# Patient Record
Sex: Female | Born: 1985 | Hispanic: No | Marital: Married | Smoking: Never smoker
Health system: Southern US, Community
[De-identification: ages and names within clinical notes are randomized; demographics above are authoritative.]

## PROBLEM LIST (undated history)

## (undated) DIAGNOSIS — D649 Anemia, unspecified: Secondary | ICD-10-CM

## (undated) DIAGNOSIS — Z789 Other specified health status: Secondary | ICD-10-CM

## (undated) HISTORY — DX: Other specified health status: Z78.9

## (undated) HISTORY — PX: NO PAST SURGERIES: SHX2092

---

## 2012-04-16 ENCOUNTER — Other Ambulatory Visit: Payer: Self-pay

## 2012-04-16 LAB — OB RESULTS CONSOLE ANTIBODY SCREEN: Antibody Screen: NEGATIVE

## 2012-04-16 LAB — OB RESULTS CONSOLE GC/CHLAMYDIA
Chlamydia: NEGATIVE
Gonorrhea: NEGATIVE

## 2012-04-16 LAB — OB RESULTS CONSOLE ABO/RH

## 2012-05-12 ENCOUNTER — Other Ambulatory Visit (HOSPITAL_COMMUNITY): Payer: Self-pay | Admitting: Obstetrics and Gynecology

## 2012-05-12 DIAGNOSIS — Q369 Cleft lip, unilateral: Secondary | ICD-10-CM

## 2012-05-12 DIAGNOSIS — Z3689 Encounter for other specified antenatal screening: Secondary | ICD-10-CM

## 2012-05-20 ENCOUNTER — Encounter (HOSPITAL_COMMUNITY): Payer: Self-pay

## 2012-05-20 ENCOUNTER — Ambulatory Visit (HOSPITAL_COMMUNITY)
Admission: RE | Admit: 2012-05-20 | Discharge: 2012-05-20 | Disposition: A | Discharge status: Home / Self Care | Payer: Medicaid Other | Source: Ambulatory Visit | Attending: Obstetrics and Gynecology | Admitting: Obstetrics and Gynecology

## 2012-05-20 DIAGNOSIS — Z1389 Encounter for screening for other disorder: Secondary | ICD-10-CM | POA: Insufficient documentation

## 2012-05-20 DIAGNOSIS — O358XX0 Maternal care for other (suspected) fetal abnormality and damage, not applicable or unspecified: Secondary | ICD-10-CM | POA: Insufficient documentation

## 2012-05-20 DIAGNOSIS — Z363 Encounter for antenatal screening for malformations: Secondary | ICD-10-CM | POA: Insufficient documentation

## 2012-05-20 DIAGNOSIS — Z3689 Encounter for other specified antenatal screening: Secondary | ICD-10-CM

## 2012-05-20 DIAGNOSIS — Q369 Cleft lip, unilateral: Secondary | ICD-10-CM

## 2012-05-20 HISTORY — DX: Anemia, unspecified: D64.9

## 2012-05-20 NOTE — Progress Notes (Signed)
Genetic Counseling  High-Risk Gestation Note  Appointment Date:  05/20/2012 Referred By: [name], MD Date of Birth:  1986-03-06    Pregnancy History: G1P0 Estimated Date of Delivery: 10/05/12 Estimated Gestational Age: [redacted]w[redacted]d Attending: [name], MD   Pamela Hoover and her partner, Pamela Hoover, were seen for genetic counseling because of previous ultrasound finding of fetal cleft lip.   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 Left, mild pyelectasis was also visualized. Complete ultrasound results reported separately.   We discussed the ultrasound finding of cleft lip and palate. In normal embryological development, the fetal lip usually closes by 7-[redacted] weeks gestation and the fetal palate usually closes by [redacted] weeks gestation.  When parts of these structures do not fuse properly, cleft lip and/or palate (CL/P) results.  CL/P is twice as common in males as it is in females.  Approximately 25% (1 in 4) of all cleft lip and/or palate (CL/P) cases are cleft lip only, 50% (1 in 2) are cleft lip and palate, and 25% (1 in 4) are cleft palate only.  The incidence of CL/P varies in different ethnic populations.  In addition to ethnicity, other factors may increase the chance of a CL/P including some prenatal exposures, alcohol and drug use, cigarette smoking, or folic acid deficiency.  We also discussed the ultrasound finding of pyelectasis. We discussed that fetal pyelectasis is defined as the dilatation of the fetal renal pelvis/pelvises due to excess urine. This finding is estimated to occur in 2-3% of fetuses.  The female to female ratio is 2:1.  Typically, babies with mild pyelectasis are born normal and healthy and we are usually unable to determine why this extra fluid is present.  This urine accumulation may regress, stay the same or continue to accumulate.  The more fluid that accumulates, the more likely this fluid could be the result of a  compromise in kidney function, an obstruction, or narrowing of the ureters which transport urine out of the body, thus causing backflow of fluid into the kidneys.  Therefore, it is important to follow pyelectasis to make sure it does not become more concerning.  Also, in some cases postnatal evaluation of baby's kidneys may be warranted.  We discussed that the finding of pyelectasis is associated with an increased risk for fetal aneuploidy.  This risk is highest when other anomalies or fetal differences are visualized.  Cleft lip and palate (CL/P) is most often an isolated condition, but can be present in combination with other birth defects possibly as part of a genetic syndrome. Approximately, 7-13% of individuals with a cleft lip and 11-14% of individuals with a cleft lip and palate are born with additional birth defects.  Many genetic syndromes are associated with cleft lip and/or palate which may not be identified on ultrasound and would not be detected by amniocentesis.  For this reason, a genetics evaluation may be recommended sometime after birth in order to assess for an underlying genetic syndrome.  When there is no syndrome as the cause, then the cleft lip or palate is typically suspected to be caused by a combination of genetic and environmental factors (multifactorial inheritance).   We reviewed chromosomes and genes, and examples of chromosome conditions. We reviewed that [name] previously had first trimester screening which reportedly reduced the risks for fetal Down syndrome, trisomy 73, and trisomy 13 to less than 1 in 10,000. This screen does not diagnose or rule out these conditions but adjusts the  a priori risk to provide a pregnancy specific risk assessment. We discussed that the ultrasound findings increased the risk for underlying fetal aneuploidy. We discussed that cleft lip +/- palate is associated with 1-4% risk for fetal aneuploidy.  We discussed the option of amniocentesis,  including the 1 in 300-500 risk for complications including spontaneous pregnancy loss.  They understand that amniocentesis allows evaluation of the fetal chromosomes, but cannot detect all genetic conditions.  Specifically, we discussed that single gene conditions are difficult to diagnose prenatally unless a specific condition is suspected based on additional ultrasound findings or family history. We discussed the risks, limitations, and benefits of each screening and testing option. After thoughtful consideration, this couple declined amniocentesis, stating that they would prefer to pursue any additional genetic testing postnatally, if warranted at that time.  Additionally, noninvasive prenatal testing (NIPT) is available as a screening option. This option was not discussed in detail at this time, given that the couple indicated that they did not want to pursue additional testing regarding potential additional underlying medical concerns in pregnancy at this time. The option of having their baby evaluated by a Medical Geneticist is also available so that a potential diagnosis for the baby and, ultimately, a recurrence risk for this couple can be made.  Without further information, we may not be able to answer questions related to why this happened or the recurrence risk for future pregnancies.    Given that the finding of cleft lip +/- palate is associated with an increased risk for additional structural anomalies, we discussed the option of a fetal echocardiogram.  Additionally, we discussed the option of meeting with a pediatric plastic and reconstructive surgeon to discussed management and treatment of cleft lip and palate. We discussed that there are physicians from [facility] who provide consultative services in [place]. The couple declined fetal echocardiogram and prenatal consultation with pediatric plastic and reconstructive surgery at this time. They stated that they  preferred to follow-up with plastic surgery and craniofacial team postnatally. The couple stated that they would contact our office if they reconsidered and desired either of these appointments during pregnancy. A follow-up ultrasound was planned in 6 weeks to reassess fetal kidneys.   Both family histories were reviewed and found to be noncontributory for birth defects, mental retardation, and known genetic conditions. Without further information regarding the provided family history, an accurate genetic risk cannot be calculated. Further genetic counseling is warranted if more information is obtained.  Ms. Jamira Barfuss denied exposure to environmental toxins or chemical agents. She denied the use of alcohol, tobacco or street drugs. She denied significant viral illnesses during the course of her pregnancy. Her medical and surgical histories were noncontributory.   I counseled this couple regarding the above risks and available options.  The approximate face-to-face time with the genetic counselor was 30 minutes.  Quinn Plowman, MS,  Certified Genetic Counselor 05/20/2012

## 2012-06-11 ENCOUNTER — Encounter (HOSPITAL_COMMUNITY): Payer: Self-pay | Admitting: Emergency Medicine

## 2012-06-11 ENCOUNTER — Emergency Department (HOSPITAL_COMMUNITY)
Admission: EM | Admit: 2012-06-11 | Discharge: 2012-06-12 | Discharge status: Against Medical Advice | Payer: Medicaid Other | Attending: Emergency Medicine | Admitting: Emergency Medicine

## 2012-06-11 DIAGNOSIS — Z3490 Encounter for supervision of normal pregnancy, unspecified, unspecified trimester: Secondary | ICD-10-CM

## 2012-06-11 DIAGNOSIS — O99891 Other specified diseases and conditions complicating pregnancy: Secondary | ICD-10-CM | POA: Insufficient documentation

## 2012-06-11 DIAGNOSIS — R109 Unspecified abdominal pain: Secondary | ICD-10-CM

## 2012-06-11 DIAGNOSIS — O99019 Anemia complicating pregnancy, unspecified trimester: Secondary | ICD-10-CM | POA: Insufficient documentation

## 2012-06-11 DIAGNOSIS — D649 Anemia, unspecified: Secondary | ICD-10-CM | POA: Insufficient documentation

## 2012-06-11 NOTE — ED Notes (Addendum)
C/o RUQ and LLQ pain since yesterday.  Reports sob.  Pt states she is 5 months pregnant.  Due date 10/05/12. Denies urinary complaints.  OB-GYN is Dr. Senaida Ores at [facility].  Last appt 2-3 weeks ago.

## 2012-06-11 NOTE — ED Notes (Signed)
Pt c/o RUQ and LLQ abd pain since yesterday, pt describes pain as sharp and constant pain. Pt reports she has been feeling the baby move.

## 2012-06-11 NOTE — ED Notes (Signed)
Maxine Glenn, [place] notifying [facility] nurse that pt is here.

## 2012-06-11 NOTE — Progress Notes (Signed)
Pt is a G1P0, at 23wks 3 days (EDC 10/05/12) gestation.  She complains of RUQ and LLQ pain greater then 24 hrs.  Fetal monitoring, after 20 minutes; FHT 145bpm, 10x10 accels and occ mild variable. 2 contractions seen on 20 minute tracing, but pt did not complain of pain and abd remained soft.  Fetal monitoring d/c'd and toco remains in place after 20 minute tracing.  Marcie Bal RN, [place]

## 2012-06-12 ENCOUNTER — Ambulatory Visit (HOSPITAL_COMMUNITY): Admission: RE | Admit: 2012-06-12 | Payer: Medicaid Other | Source: Ambulatory Visit

## 2012-06-12 NOTE — Progress Notes (Signed)
Pt states she feels much better and must leave now and has removed toco monitor.  She has had no more contractions in an hour.  Dr Ellyn Hack paged @ [phone] and has not yet returned call.  Pt states she does not want to wait for me to talk to Dr. Ellyn Hack.  Advised patient that if she feels worse she should go to [facility] and if it is during the day she should call Dr. Emeline Darling office. Marcie Bal RN  [place]

## 2012-06-12 NOTE — ED Provider Notes (Addendum)
History     CSN: [id number]  Arrival date & time 06/11/12  2223   First MD Initiated Contact with Patient 06/12/12 0007      Chief Complaint  Patient presents with  . Abdominal Pain    (Consider location/radiation/quality/duration/timing/severity/associated sxs/prior treatment) Patient is a 26 y.o. female presenting with abdominal pain. The history is provided by the patient.  Abdominal Pain The primary symptoms of the illness include abdominal pain. The primary symptoms of the illness do not include fever, nausea, vomiting, diarrhea, vaginal discharge or vaginal bleeding. The current episode started yesterday. The onset of the illness was sudden. The problem has not changed (intermittent pain) since onset. The abdominal pain is located in the RUQ and LLQ. The abdominal pain does not radiate. The severity of the abdominal pain is 7/10 (currently pain is gone). The abdominal pain is relieved by nothing. The abdominal pain is exacerbated by eating.  The patient states that she believes she is currently pregnant. Symptoms associated with the illness do not include chills, anorexia, constipation, urgency, frequency or back pain.    Past Medical History  Diagnosis Date  . Anemia     History reviewed. No pertinent past surgical history.  No family history on file.  History  Substance Use Topics  . Smoking status: Never Smoker   . Smokeless tobacco: Not on file  . Alcohol Use: No    OB History    Grav Para Term Preterm Abortions TAB SAB Ect Mult Living   1               Review of Systems  Constitutional: Negative for fever and chills.  Gastrointestinal: Positive for abdominal pain. Negative for nausea, vomiting, diarrhea, constipation and anorexia.  Genitourinary: Negative for urgency, frequency, vaginal bleeding and vaginal discharge.  Musculoskeletal: Negative for back pain.  All other systems reviewed and are negative.    Allergies  Review of patient's allergies  indicates no known allergies.  Home Medications   Current Outpatient Rx  Name Route Sig Dispense Refill  . IRON PO Oral Take 1 tablet by mouth 2 (two) times daily.      BP 116/66  Pulse 94  Temp 98.4 F (36.9 C) (Oral)  Resp 17  SpO2 98%  LMP 12/30/2011  Physical Exam  Nursing note and vitals reviewed. Constitutional: She is oriented to person, place, and time. She appears well-developed and well-nourished. No distress.  HENT:  Head: Normocephalic and atraumatic.  Mouth/Throat: Oropharynx is clear and moist.  Eyes: Conjunctivae normal and EOM are normal. Pupils are equal, round, and reactive to light.  Neck: Normal range of motion. Neck supple.  Cardiovascular: Normal rate, regular rhythm and intact distal pulses.   No murmur heard. Pulmonary/Chest: Effort normal and breath sounds normal. No respiratory distress. She has no wheezes. She has no rales.  Abdominal: Soft. She exhibits no distension. There is tenderness in the right upper quadrant. There is no rebound, no guarding and negative Murphy's sign.       Gravid uterus just above the umbilicus  Musculoskeletal: Normal range of motion. She exhibits no edema and no tenderness.  Neurological: She is alert and oriented to person, place, and time.  Skin: Skin is warm and dry. No rash noted. No erythema.  Psychiatric: She has a normal mood and affect. Her behavior is normal.    ED Course  Procedures (including critical care time)  Labs Reviewed - No data to display No results found.   1. Abdominal  pain, other specified site   2. Pregnant       MDM   Patient is [redacted] weeks pregnant and arrived to 2 right upper quadrant and right lower quadrant pain that started yesterday. The pain is worse after eating. And then it resolves for some time. The OB nurse has already been in to see the patient and she's been on the monitor for over an hour when I saw her. She had had 2 contractions early on but the baby is in no distress and  patient has had no further contractions. With the contractions the patient's abdomen was not tightening. On exam she still has mild right upper caught her and tenderness in with her symptoms concerning for possible: Lithiasis. Discussed with her about getting blood work in the right upper quadrant ultrasound. However patient refused and states she was feeling better and decided to go home. She states that she will followup with her doctor if things worsen to return to [facility]. Do not feel that patient isn't an emminent danger of delivery. She did sign out [facility]        Gwyneth Sprout, MD 06/12/12 0440  Gwyneth Sprout, MD 06/12/12 [phone]

## 2012-06-12 NOTE — ED Notes (Signed)
Notified Dr. Anitra Lauth that pt is refusing further exam and treatment.  Pt wants to go home.  Discussed with pt that she will be leaving AMA.  Advised pt of the risks of leaving without further screening/treatment.  Pt verbalizes understanding and continues to insist on leaving.  Pt signed signature pad AMA and this RN signed as witness.

## 2012-06-30 ENCOUNTER — Other Ambulatory Visit (HOSPITAL_COMMUNITY): Payer: Self-pay | Admitting: Maternal and Fetal Medicine

## 2012-06-30 DIAGNOSIS — O358XX0 Maternal care for other (suspected) fetal abnormality and damage, not applicable or unspecified: Secondary | ICD-10-CM

## 2012-07-01 ENCOUNTER — Other Ambulatory Visit (HOSPITAL_COMMUNITY): Payer: Self-pay | Admitting: Obstetrics and Gynecology

## 2012-07-01 ENCOUNTER — Ambulatory Visit (HOSPITAL_COMMUNITY): Payer: Medicaid Other

## 2012-07-01 ENCOUNTER — Ambulatory Visit (HOSPITAL_COMMUNITY)
Admission: RE | Admit: 2012-07-01 | Discharge: 2012-07-01 | Disposition: A | Discharge status: Home / Self Care | Payer: Medicaid Other | Source: Ambulatory Visit | Attending: Obstetrics and Gynecology | Admitting: Obstetrics and Gynecology

## 2012-07-01 VITALS — BP 113/72 | HR 100 | Wt 153.8 lb

## 2012-07-01 DIAGNOSIS — O358XX0 Maternal care for other (suspected) fetal abnormality and damage, not applicable or unspecified: Secondary | ICD-10-CM | POA: Insufficient documentation

## 2012-07-01 DIAGNOSIS — Z3689 Encounter for other specified antenatal screening: Secondary | ICD-10-CM | POA: Insufficient documentation

## 2012-07-01 NOTE — Progress Notes (Signed)
Pamela Hoover was seen for ultrasound appointment today.  Please see [facility] report for details.

## 2012-08-12 ENCOUNTER — Ambulatory Visit (HOSPITAL_COMMUNITY)
Admission: RE | Admit: 2012-08-12 | Discharge: 2012-08-12 | Disposition: A | Discharge status: Home / Self Care | Payer: Medicaid Other | Source: Ambulatory Visit | Attending: Obstetrics and Gynecology | Admitting: Obstetrics and Gynecology

## 2012-08-12 VITALS — BP 110/68 | HR 95 | Wt 160.0 lb

## 2012-08-12 DIAGNOSIS — O358XX0 Maternal care for other (suspected) fetal abnormality and damage, not applicable or unspecified: Secondary | ICD-10-CM

## 2012-09-26 ENCOUNTER — Telehealth (HOSPITAL_COMMUNITY): Payer: Self-pay | Admitting: *Deleted

## 2012-09-26 ENCOUNTER — Encounter (HOSPITAL_COMMUNITY): Payer: Self-pay | Admitting: *Deleted

## 2012-09-26 NOTE — Telephone Encounter (Signed)
Preadmission screen Interpreter number [id number] 

## 2012-10-03 ENCOUNTER — Inpatient Hospital Stay (HOSPITAL_COMMUNITY): Payer: Medicaid Other | Admitting: Anesthesiology

## 2012-10-03 ENCOUNTER — Encounter (HOSPITAL_COMMUNITY)
Admission: AD | Disposition: A | Discharge status: Home / Self Care | Payer: Self-pay | Source: Ambulatory Visit | Attending: Obstetrics and Gynecology

## 2012-10-03 ENCOUNTER — Inpatient Hospital Stay (HOSPITAL_COMMUNITY)
Admission: AD | Admit: 2012-10-03 | Discharge: 2012-10-06 | DRG: 766 | Disposition: A | Discharge status: Home / Self Care | Payer: Medicaid Other | Source: Ambulatory Visit | Attending: Obstetrics and Gynecology | Admitting: Obstetrics and Gynecology

## 2012-10-03 ENCOUNTER — Encounter (HOSPITAL_COMMUNITY): Payer: Self-pay | Admitting: Anesthesiology

## 2012-10-03 ENCOUNTER — Encounter (HOSPITAL_COMMUNITY): Payer: Self-pay | Admitting: *Deleted

## 2012-10-03 DIAGNOSIS — Z98891 History of uterine scar from previous surgery: Secondary | ICD-10-CM

## 2012-10-03 DIAGNOSIS — O9902 Anemia complicating childbirth: Secondary | ICD-10-CM | POA: Diagnosis present

## 2012-10-03 DIAGNOSIS — O358XX0 Maternal care for other (suspected) fetal abnormality and damage, not applicable or unspecified: Secondary | ICD-10-CM | POA: Diagnosis present

## 2012-10-03 DIAGNOSIS — D649 Anemia, unspecified: Secondary | ICD-10-CM | POA: Diagnosis present

## 2012-10-03 LAB — RPR: RPR Ser Ql: NONREACTIVE

## 2012-10-03 LAB — CBC
Hemoglobin: 10 g/dL — ABNORMAL LOW (ref 12.0–15.0)
MCH: 23.8 pg — ABNORMAL LOW (ref 26.0–34.0)
MCHC: 31.3 g/dL (ref 30.0–36.0)
RDW: 17.9 % — ABNORMAL HIGH (ref 11.5–15.5)

## 2012-10-03 SURGERY — Surgical Case
Anesthesia: Regional | Site: Abdomen | Wound class: Clean Contaminated

## 2012-10-03 MED ORDER — KETOROLAC TROMETHAMINE 30 MG/ML IJ SOLN: 30.0000 mg | Freq: Four times a day (QID) | INTRAMUSCULAR | Status: AC | PRN

## 2012-10-03 MED ORDER — LACTATED RINGERS IV SOLN
INTRAVENOUS | Status: DC | PRN
  Administered 2012-10-03 (×3): via INTRAVENOUS

## 2012-10-03 MED ORDER — SCOPOLAMINE 1 MG/3DAYS TD PT72
1.0000 | MEDICATED_PATCH | Freq: Once | TRANSDERMAL | Status: AC
  Administered 2012-10-03: 1.5 mg via TRANSDERMAL

## 2012-10-03 MED ORDER — LACTATED RINGERS IV SOLN
INTRAVENOUS | Status: DC | PRN
  Administered 2012-10-03: 14:00:00 via INTRAVENOUS

## 2012-10-03 MED ORDER — MENTHOL 3 MG MT LOZG: 1.0000 | LOZENGE | OROMUCOSAL | Status: DC | PRN

## 2012-10-03 MED ORDER — OXYTOCIN 10 UNIT/ML IJ SOLN
INTRAMUSCULAR | Status: AC
  Filled 2012-10-03: qty 4

## 2012-10-03 MED ORDER — IBUPROFEN 600 MG PO TABS
600.0000 mg | ORAL_TABLET | Freq: Four times a day (QID) | ORAL | Status: DC | PRN
  Administered 2012-10-04: 600 mg via ORAL
  Filled 2012-10-03: qty 1

## 2012-10-03 MED ORDER — BUTORPHANOL TARTRATE 1 MG/ML IJ SOLN: 1.0000 mg | Freq: Once | INTRAMUSCULAR | Status: DC

## 2012-10-03 MED ORDER — ONDANSETRON HCL 4 MG/2ML IJ SOLN
INTRAMUSCULAR | Status: DC | PRN
  Administered 2012-10-03: 4 mg via INTRAVENOUS

## 2012-10-03 MED ORDER — DIPHENHYDRAMINE HCL 25 MG PO CAPS
25.0000 mg | ORAL_CAPSULE | ORAL | Status: DC | PRN
  Filled 2012-10-03: qty 1

## 2012-10-03 MED ORDER — TETANUS-DIPHTH-ACELL PERTUSSIS 5-2.5-18.5 LF-MCG/0.5 IM SUSP: 0.5000 mL | Freq: Once | INTRAMUSCULAR | Status: DC

## 2012-10-03 MED ORDER — LACTATED RINGERS IV SOLN: 500.0000 mL | Freq: Once | INTRAVENOUS | Status: DC

## 2012-10-03 MED ORDER — LACTATED RINGERS IV SOLN: INTRAVENOUS | Status: DC

## 2012-10-03 MED ORDER — FENTANYL CITRATE 0.05 MG/ML IJ SOLN: 25.0000 ug | INTRAMUSCULAR | Status: DC | PRN

## 2012-10-03 MED ORDER — NALOXONE HCL 0.4 MG/ML IJ SOLN: 0.4000 mg | INTRAMUSCULAR | Status: DC | PRN

## 2012-10-03 MED ORDER — DIPHENHYDRAMINE HCL 25 MG PO CAPS: 25.0000 mg | ORAL_CAPSULE | Freq: Four times a day (QID) | ORAL | Status: DC | PRN

## 2012-10-03 MED ORDER — BUTORPHANOL TARTRATE 1 MG/ML IJ SOLN
INTRAMUSCULAR | Status: AC
  Administered 2012-10-03: 1 mg
  Filled 2012-10-03: qty 1

## 2012-10-03 MED ORDER — DIBUCAINE 1 % RE OINT: 1.0000 "application " | TOPICAL_OINTMENT | RECTAL | Status: DC | PRN

## 2012-10-03 MED ORDER — EPHEDRINE 5 MG/ML INJ: 10.0000 mg | INTRAVENOUS | Status: DC | PRN

## 2012-10-03 MED ORDER — ONDANSETRON HCL 4 MG/2ML IJ SOLN: 4.0000 mg | Freq: Three times a day (TID) | INTRAMUSCULAR | Status: DC | PRN

## 2012-10-03 MED ORDER — PHENYLEPHRINE 40 MCG/ML (10ML) SYRINGE FOR IV PUSH (FOR BLOOD PRESSURE SUPPORT): 80.0000 ug | PREFILLED_SYRINGE | INTRAVENOUS | Status: DC | PRN

## 2012-10-03 MED ORDER — OXYTOCIN BOLUS FROM INFUSION: 500.0000 mL | INTRAVENOUS | Status: DC

## 2012-10-03 MED ORDER — SODIUM BICARBONATE 8.4 % IV SOLN
INTRAVENOUS | Status: AC
  Filled 2012-10-03: qty 50

## 2012-10-03 MED ORDER — FLEET ENEMA 7-19 GM/118ML RE ENEM: 1.0000 | ENEMA | RECTAL | Status: DC | PRN

## 2012-10-03 MED ORDER — SENNOSIDES-DOCUSATE SODIUM 8.6-50 MG PO TABS
2.0000 | ORAL_TABLET | Freq: Every day | ORAL | Status: DC
  Administered 2012-10-03 – 2012-10-05 (×3): 2 via ORAL

## 2012-10-03 MED ORDER — ONDANSETRON HCL 4 MG PO TABS: 4.0000 mg | ORAL_TABLET | ORAL | Status: DC | PRN

## 2012-10-03 MED ORDER — IBUPROFEN 600 MG PO TABS: 600.0000 mg | ORAL_TABLET | Freq: Four times a day (QID) | ORAL | Status: DC | PRN

## 2012-10-03 MED ORDER — CEFAZOLIN SODIUM 1-5 GM-% IV SOLN
INTRAVENOUS | Status: DC | PRN
  Administered 2012-10-03: 2 g via INTRAVENOUS

## 2012-10-03 MED ORDER — DIPHENHYDRAMINE HCL 50 MG/ML IJ SOLN: 12.5000 mg | INTRAMUSCULAR | Status: DC | PRN

## 2012-10-03 MED ORDER — SODIUM BICARBONATE 8.4 % IV SOLN
INTRAVENOUS | Status: DC | PRN
  Administered 2012-10-03: 3 mL via EPIDURAL

## 2012-10-03 MED ORDER — NALBUPHINE HCL 10 MG/ML IJ SOLN
5.0000 mg | INTRAMUSCULAR | Status: DC | PRN
  Filled 2012-10-03: qty 1

## 2012-10-03 MED ORDER — LACTATED RINGERS IV SOLN
500.0000 mL | INTRAVENOUS | Status: DC | PRN
  Administered 2012-10-03: 300 mL via INTRAVENOUS

## 2012-10-03 MED ORDER — ACETAMINOPHEN 325 MG PO TABS: 650.0000 mg | ORAL_TABLET | ORAL | Status: DC | PRN

## 2012-10-03 MED ORDER — KETOROLAC TROMETHAMINE 30 MG/ML IJ SOLN
INTRAMUSCULAR | Status: AC
  Filled 2012-10-03: qty 1

## 2012-10-03 MED ORDER — LIDOCAINE HCL (PF) 1 % IJ SOLN
INTRAMUSCULAR | Status: DC | PRN
  Administered 2012-10-03 (×2): 5 mL

## 2012-10-03 MED ORDER — OXYCODONE-ACETAMINOPHEN 5-325 MG PO TABS: 1.0000 | ORAL_TABLET | ORAL | Status: DC | PRN

## 2012-10-03 MED ORDER — OXYTOCIN 40 UNITS IN LACTATED RINGERS INFUSION - SIMPLE MED
1.0000 m[IU]/min | INTRAVENOUS | Status: DC
  Administered 2012-10-03: 1 m[IU]/min via INTRAVENOUS

## 2012-10-03 MED ORDER — SCOPOLAMINE 1 MG/3DAYS TD PT72
MEDICATED_PATCH | TRANSDERMAL | Status: AC
  Filled 2012-10-03: qty 1

## 2012-10-03 MED ORDER — ONDANSETRON HCL 4 MG/2ML IJ SOLN: 4.0000 mg | INTRAMUSCULAR | Status: DC | PRN

## 2012-10-03 MED ORDER — LANOLIN HYDROUS EX OINT: 1.0000 "application " | TOPICAL_OINTMENT | CUTANEOUS | Status: DC | PRN

## 2012-10-03 MED ORDER — LACTATED RINGERS IV SOLN
INTRAVENOUS | Status: DC
  Administered 2012-10-03: 04:00:00 via INTRAVENOUS

## 2012-10-03 MED ORDER — MORPHINE SULFATE 0.5 MG/ML IJ SOLN
INTRAMUSCULAR | Status: AC
  Filled 2012-10-03: qty 10

## 2012-10-03 MED ORDER — ZOLPIDEM TARTRATE 5 MG PO TABS: 5.0000 mg | ORAL_TABLET | Freq: Every evening | ORAL | Status: DC | PRN

## 2012-10-03 MED ORDER — MEASLES, MUMPS & RUBELLA VAC ~~LOC~~ INJ: 0.5000 mL | INJECTION | Freq: Once | SUBCUTANEOUS | Status: DC

## 2012-10-03 MED ORDER — OXYTOCIN 40 UNITS IN LACTATED RINGERS INFUSION - SIMPLE MED
62.5000 mL/h | INTRAVENOUS | Status: DC
  Filled 2012-10-03 (×2): qty 1000

## 2012-10-03 MED ORDER — TERBUTALINE SULFATE 1 MG/ML IJ SOLN: 0.2500 mg | Freq: Once | INTRAMUSCULAR | Status: DC | PRN

## 2012-10-03 MED ORDER — LIDOCAINE HCL (PF) 1 % IJ SOLN
30.0000 mL | INTRAMUSCULAR | Status: DC | PRN
  Filled 2012-10-03: qty 30

## 2012-10-03 MED ORDER — NALOXONE HCL 1 MG/ML IJ SOLN
1.0000 ug/kg/h | INTRAVENOUS | Status: DC | PRN
  Filled 2012-10-03: qty 2

## 2012-10-03 MED ORDER — PHENYLEPHRINE 40 MCG/ML (10ML) SYRINGE FOR IV PUSH (FOR BLOOD PRESSURE SUPPORT)
80.0000 ug | PREFILLED_SYRINGE | INTRAVENOUS | Status: DC | PRN
  Filled 2012-10-03: qty 5

## 2012-10-03 MED ORDER — SIMETHICONE 80 MG PO CHEW: 80.0000 mg | CHEWABLE_TABLET | ORAL | Status: DC | PRN

## 2012-10-03 MED ORDER — SIMETHICONE 80 MG PO CHEW
80.0000 mg | CHEWABLE_TABLET | Freq: Three times a day (TID) | ORAL | Status: DC
  Administered 2012-10-03 – 2012-10-06 (×9): 80 mg via ORAL

## 2012-10-03 MED ORDER — LACTATED RINGERS IV SOLN
INTRAVENOUS | Status: DC
  Administered 2012-10-03 – 2012-10-04 (×2): via INTRAVENOUS

## 2012-10-03 MED ORDER — MAGNESIUM HYDROXIDE 400 MG/5ML PO SUSP: 30.0000 mL | ORAL | Status: DC | PRN

## 2012-10-03 MED ORDER — MEPERIDINE HCL 25 MG/ML IJ SOLN: 6.2500 mg | INTRAMUSCULAR | Status: DC | PRN

## 2012-10-03 MED ORDER — IBUPROFEN 600 MG PO TABS
600.0000 mg | ORAL_TABLET | Freq: Four times a day (QID) | ORAL | Status: DC
Start: 2012-10-03 — End: 2012-10-06
  Administered 2012-10-03 – 2012-10-06 (×9): 600 mg via ORAL
  Filled 2012-10-03 (×10): qty 1

## 2012-10-03 MED ORDER — ONDANSETRON HCL 4 MG/2ML IJ SOLN
INTRAMUSCULAR | Status: AC
  Filled 2012-10-03: qty 2

## 2012-10-03 MED ORDER — OXYTOCIN 40 UNITS IN LACTATED RINGERS INFUSION - SIMPLE MED: 62.5000 mL/h | INTRAVENOUS | Status: DC

## 2012-10-03 MED ORDER — PHENYLEPHRINE HCL 10 MG/ML IJ SOLN
INTRAMUSCULAR | Status: DC | PRN
  Administered 2012-10-03: 80 ug via INTRAVENOUS
  Administered 2012-10-03: 40 ug via INTRAVENOUS
  Administered 2012-10-03 (×3): 80 ug via INTRAVENOUS

## 2012-10-03 MED ORDER — OXYTOCIN 10 UNIT/ML IJ SOLN
40.0000 [IU] | INTRAVENOUS | Status: DC | PRN
  Administered 2012-10-03: 40 [IU] via INTRAVENOUS

## 2012-10-03 MED ORDER — PHENYLEPHRINE 40 MCG/ML (10ML) SYRINGE FOR IV PUSH (FOR BLOOD PRESSURE SUPPORT)
PREFILLED_SYRINGE | INTRAVENOUS | Status: AC
  Filled 2012-10-03: qty 15

## 2012-10-03 MED ORDER — MORPHINE SULFATE (PF) 0.5 MG/ML IJ SOLN
INTRAMUSCULAR | Status: DC | PRN
  Administered 2012-10-03: 4 mg via EPIDURAL

## 2012-10-03 MED ORDER — PRENATAL MULTIVITAMIN CH
1.0000 | ORAL_TABLET | Freq: Every day | ORAL | Status: DC
  Administered 2012-10-04 – 2012-10-06 (×3): 1 via ORAL
  Filled 2012-10-03 (×3): qty 1

## 2012-10-03 MED ORDER — WITCH HAZEL-GLYCERIN EX PADS: 1.0000 "application " | MEDICATED_PAD | CUTANEOUS | Status: DC | PRN

## 2012-10-03 MED ORDER — FENTANYL 2.5 MCG/ML BUPIVACAINE 1/10 % EPIDURAL INFUSION (WH - ANES)
14.0000 mL/h | INTRAMUSCULAR | Status: DC
  Administered 2012-10-03 (×2): 14 mL/h via EPIDURAL
  Filled 2012-10-03 (×2): qty 125

## 2012-10-03 MED ORDER — CITRIC ACID-SODIUM CITRATE 334-500 MG/5ML PO SOLN
30.0000 mL | ORAL | Status: DC | PRN
  Administered 2012-10-03: 30 mL via ORAL
  Filled 2012-10-03: qty 15

## 2012-10-03 MED ORDER — ONDANSETRON HCL 4 MG/2ML IJ SOLN: 4.0000 mg | Freq: Four times a day (QID) | INTRAMUSCULAR | Status: DC | PRN

## 2012-10-03 MED ORDER — OXYCODONE-ACETAMINOPHEN 5-325 MG PO TABS
1.0000 | ORAL_TABLET | ORAL | Status: DC | PRN
  Administered 2012-10-04 – 2012-10-05 (×3): 1 via ORAL
  Administered 2012-10-05: 2 via ORAL
  Administered 2012-10-06 (×2): 1 via ORAL
  Filled 2012-10-03: qty 2
  Filled 2012-10-03 (×5): qty 1

## 2012-10-03 MED ORDER — DIPHENHYDRAMINE HCL 50 MG/ML IJ SOLN: 25.0000 mg | INTRAMUSCULAR | Status: DC | PRN

## 2012-10-03 MED ORDER — METOCLOPRAMIDE HCL 5 MG/ML IJ SOLN: 10.0000 mg | Freq: Three times a day (TID) | INTRAMUSCULAR | Status: DC | PRN

## 2012-10-03 MED ORDER — EPHEDRINE 5 MG/ML INJ
10.0000 mg | INTRAVENOUS | Status: DC | PRN
  Filled 2012-10-03: qty 4

## 2012-10-03 MED ORDER — KETOROLAC TROMETHAMINE 30 MG/ML IJ SOLN
30.0000 mg | Freq: Four times a day (QID) | INTRAMUSCULAR | Status: AC | PRN
  Administered 2012-10-03: 30 mg via INTRAVENOUS

## 2012-10-03 MED ORDER — EPHEDRINE 5 MG/ML INJ
INTRAVENOUS | Status: AC
  Filled 2012-10-03: qty 10

## 2012-10-03 MED ORDER — SODIUM CHLORIDE 0.9 % IJ SOLN: 3.0000 mL | INTRAMUSCULAR | Status: DC | PRN

## 2012-10-03 MED ORDER — LIDOCAINE-EPINEPHRINE (PF) 2 %-1:200000 IJ SOLN
INTRAMUSCULAR | Status: AC
  Filled 2012-10-03: qty 20

## 2012-10-03 SURGICAL SUPPLY — 31 items
CLOTH BEACON ORANGE TIMEOUT ST (SAFETY) ×2 IMPLANT
CONTAINER PREFILL 10% NBF 15ML (MISCELLANEOUS) IMPLANT
DRAPE LG THREE QUARTER DISP (DRAPES) ×2 IMPLANT
DRSG OPSITE POSTOP 4X10 (GAUZE/BANDAGES/DRESSINGS) ×2 IMPLANT
DURAPREP 26ML APPLICATOR (WOUND CARE) ×2 IMPLANT
ELECT REM PT RETURN 9FT ADLT (ELECTROSURGICAL) ×2
ELECTRODE REM PT RTRN 9FT ADLT (ELECTROSURGICAL) ×1 IMPLANT
EXTRACTOR VACUUM KIWI (MISCELLANEOUS) IMPLANT
EXTRACTOR VACUUM M CUP 4 TUBE (SUCTIONS) IMPLANT
GLOVE BIO SURGEON STRL SZ8 (GLOVE) ×2 IMPLANT
GLOVE ORTHO TXT STRL SZ7.5 (GLOVE) ×2 IMPLANT
GOWN PREVENTION PLUS LG XLONG (DISPOSABLE) ×4 IMPLANT
KIT ABG SYR 3ML LUER SLIP (SYRINGE) IMPLANT
NEEDLE HYPO 25X5/8 SAFETYGLIDE (NEEDLE) ×2 IMPLANT
NS IRRIG 1000ML POUR BTL (IV SOLUTION) ×2 IMPLANT
PACK C SECTION WH (CUSTOM PROCEDURE TRAY) ×2 IMPLANT
PAD OB MATERNITY 4.3X12.25 (PERSONAL CARE ITEMS) ×2 IMPLANT
RTRCTR C-SECT PINK 25CM LRG (MISCELLANEOUS) ×2 IMPLANT
SLEEVE SCD COMPRESS KNEE MED (MISCELLANEOUS) IMPLANT
STAPLER VISISTAT 35W (STAPLE) ×2 IMPLANT
SUT CHROMIC 1 CTX 36 (SUTURE) ×4 IMPLANT
SUT PLAIN 0 NONE (SUTURE) IMPLANT
SUT PLAIN 2 0 XLH (SUTURE) IMPLANT
SUT VIC AB 0 CT1 27 (SUTURE) ×2
SUT VIC AB 0 CT1 27XBRD ANBCTR (SUTURE) ×2 IMPLANT
SUT VIC AB 3-0 SH 27 (SUTURE) ×1
SUT VIC AB 3-0 SH 27X BRD (SUTURE) ×1 IMPLANT
SUT VIC AB 4-0 KS 27 (SUTURE) IMPLANT
TOWEL OR 17X24 6PK STRL BLUE (TOWEL DISPOSABLE) ×6 IMPLANT
TRAY FOLEY CATH 14FR (SET/KITS/TRAYS/PACK) ×2 IMPLANT
WATER STERILE IRR 1000ML POUR (IV SOLUTION) ×2 IMPLANT

## 2012-10-03 NOTE — Anesthesia Preprocedure Evaluation (Addendum)
Anesthesia Evaluation  Patient identified by MRN, date of birth, ID band Patient awake    Reviewed: Allergy & Precautions, H&P , Patient's Chart, lab work & pertinent test results  Airway Mallampati: II TM Distance: >3 FB Neck ROM: full    Dental No notable dental hx.    Pulmonary neg pulmonary ROS,  breath sounds clear to auscultation  Pulmonary exam normal       Cardiovascular negative cardio ROS  Rhythm:regular Rate:Normal     Neuro/Psych negative neurological ROS  negative psych ROS   GI/Hepatic negative GI ROS, Neg liver ROS,   Endo/Other  negative endocrine ROS  Renal/GU negative Renal ROS     Musculoskeletal   Abdominal   Peds  Hematology negative hematology ROS (+) anemia ,   Anesthesia Other Findings   Reproductive/Obstetrics (+) Pregnancy                           Anesthesia Physical Anesthesia Plan  ASA: II and emergent  Anesthesia Plan: Epidural   Post-op Pain Management:    Induction:   Airway Management Planned:   Additional Equipment:   Intra-op Plan:   Post-operative Plan:   Informed Consent: I have reviewed the patients History and Physical, chart, labs and discussed the procedure including the risks, benefits and alternatives for the proposed anesthesia with the patient or authorized representative who has indicated his/her understanding and acceptance.     Plan Discussed with:   Anesthesia Plan Comments:        Anesthesia Quick Evaluation

## 2012-10-03 NOTE — Progress Notes (Signed)
Comfortable with epidural Afeb, VSS FHT- Cat II, variable decels, ctx q 1-3 min VE- 7-8/90/0, vtx, no change from my exam 90 minutes ago Discussed with pt that she has dilated 1-2 cm in the past 9+ hours, no change over the past 90 minutes.  I have recommended c-section for arrest of dilation and she agrees.  I previously discussed c-section procedure and risks.  OR notified, will proceed as soon as room is ready.

## 2012-10-03 NOTE — H&P (Signed)
Pamela Hoover, [name] NO.:  [id number]  MEDICAL RECORD NO.:  [id number]  LOCATION:  9167                          FACILITY:  [facility]  PHYSICIAN:  Malachi Pro. Ambrose Mantle, M.D. DATE OF BIRTH:  01-23-1986  DATE OF ADMISSION:  10/03/2012 DATE OF DISCHARGE:                             HISTORY & PHYSICAL   HISTORY OF PRESENT ILLNESS:  This is a 27 year old, [place] female, para 0, gravida 1, EDC October 05, 2012 who is admitted with labor. Blood group and type O positive.  Negative antibody.  Pap smear normal. Rubella immune.  RPR nonreactive.  Urine culture negative.  Hepatitis B surface antigen negative, HIV negative, GC and Chlamydia negative, hemoglobin AA, first trimester screen negative, AFP negative.  Cystic fibrosis screen negative.  One hour Glucola normal, group B strep negative.  The patient began her prenatal course at our office on April 16, 2012 at 15 weeks and 3 days gestation.  Her prenatal course was complicated by anemia, fetal cleft lip, and pyelectasis, the pyelectasis resolved.  During the pregnancy, the patient and her husband have met with a pediatric surgeon at [facility]    and plan for a followup visit within a week of delivery.  The patient's prenatal course other than as stated was uncomplicated she began contracting, came to the maternity admission unit, was initially 1 cm, then 2 and on a final check before admission 4-5 cm.  PAST MEDICAL HISTORY:  No medical history.  SURGICAL HISTORY:  No surgical history.  ALLERGIES:  No known drug allergies.  No latex allergy.  FAMILY MEDICAL HISTORY:  A maternal aunt has diabetes.  The patient has never drunk alcohol.  Smoked tobacco or use illicit drugs.  She went to high school in [place].  Her husband is a Curator.  PHYSICAL EXAMINATION:  VITAL SIGNS:  On admission, blood pressure 102/57, temperature 98.4, pulse 96, respirations 18. HEART:  Normal size and sounds.  No murmurs. LUNGS:  Clear to  auscultation.  Fundal height appropriate for gestational age.  Fetal heart tones are normal.  Contractions every 2-3 minutes.  The patient has received an epidural since admission.  Cervix is 6 cm and 100% vertex, at a -1 station.  ADMITTING IMPRESSION:  Intrauterine pregnancy at 39+ weeks, active labor.  The baby has cleft lip and palate and will meet with the pediatric surgeon after delivery.  I will inform the NICU that the baby does have a cleft lip and palate to see if they want to attend the delivery.     Malachi Pro. Ambrose Mantle, M.D.     TFH/MEDQ  D:  10/03/2012  T:  10/03/2012  Job:  [id number]

## 2012-10-03 NOTE — Progress Notes (Signed)
Comfortable with epidural Afeb, VSS FHT- Currently Cat II, occ variable, had some late decels on pitocin so it was turned off, ctx irreg VE- 6/90/0, vtx, IUPC and FSE applied Protracted active phase, will restart pitocin and monitor progress and FHT

## 2012-10-03 NOTE — Anesthesia Postprocedure Evaluation (Signed)
  Anesthesia Post-op Note  Patient: Pamela Hoover  Procedure(s) Performed: Procedure(s) (LRB) with comments: CESAREAN SECTION (N/A)  Patient Location: PACU  Anesthesia Type:Epidural  Level of Consciousness: awake, alert  and oriented  Airway and Oxygen Therapy: Patient Spontanous Breathing  Post-op Pain: none  Post-op Assessment: Post-op Vital signs reviewed, Patient's Cardiovascular Status Stable, Respiratory Function Stable, Patent Airway, No signs of Nausea or vomiting, Pain level controlled, No headache, No backache, No residual numbness and No residual motor weakness  Post-op Vital Signs: Reviewed and stable  Complications: No apparent anesthesia complications

## 2012-10-03 NOTE — Transfer of Care (Signed)
Immediate Anesthesia Transfer of Care Note  Patient: Pamela Hoover  Procedure(s) Performed: Procedure(s) (LRB) with comments: CESAREAN SECTION (N/A)  Patient Location: PACU  Anesthesia Type:Epidural  Level of Consciousness: awake, alert  and oriented  Airway & Oxygen Therapy: Patient Spontanous Breathing  Post-op Assessment: Report given to PACU RN and Post -op Vital signs reviewed and stable  Post vital signs: Reviewed and stable  Complications: No apparent anesthesia complications

## 2012-10-03 NOTE — Progress Notes (Signed)
Discussed risks and benefits for c-section for failure to progress, pt verbalized understanding and gave consent.

## 2012-10-03 NOTE — Anesthesia Procedure Notes (Signed)
Epidural Patient location during procedure: OB Start time: 10/03/2012 3:43 AM  Staffing Anesthesiologist: Angus Seller., Harrell Gave. Performed by: anesthesiologist   Preanesthetic Checklist Completed: patient identified, site marked, surgical consent, pre-op evaluation, timeout performed, IV checked, risks and benefits discussed and monitors and equipment checked  Epidural Patient position: sitting Prep: site prepped and draped and DuraPrep Patient monitoring: continuous pulse ox and blood pressure Approach: midline Injection technique: LOR air and LOR saline  Needle:  Needle type: Tuohy  Needle gauge: 17 G Needle length: 9 cm and 9 Needle insertion depth: 5 cm cm Catheter type: closed end flexible Catheter size: 19 Gauge Catheter at skin depth: 10 cm Test dose: negative  Assessment Events: blood not aspirated, injection not painful, no injection resistance, negative IV test and no paresthesia  Additional Notes Patient identified.  Risk benefits discussed including failed block, incomplete pain control, headache, nerve damage, paralysis, blood pressure changes, nausea, vomiting, reactions to medication both toxic or allergic, and postpartum back pain.  Patient expressed understanding and wished to proceed.  All questions were answered.  Sterile technique used throughout procedure and epidural site dressed with sterile barrier dressing. No paresthesia or other complications noted.The patient did not experience any signs of intravascular injection such as tinnitus or metallic taste in mouth nor signs of intrathecal spread such as rapid motor block. Please see nursing notes for vital signs.

## 2012-10-03 NOTE — Progress Notes (Signed)
Patient ID: Pamela Hoover, female   DOB: November 24, 1985, 27 y.o.   MRN: [id number] AROM done at 4:02 AM and since then there has been no progress in cervical dilatation. The cervix is still 6 cm and seems thicker now at 80% effaced and the vertex is at - 1 station. Will start pitocin

## 2012-10-03 NOTE — Op Note (Signed)
Preoperative diagnosis: Intrauterine pregnancy at 39 weeks, arrest of dilation, fetal cleft lip and palate Postoperative diagnosis: Same Procedure: Primary low transverse cesarean section without extensions Surgeon: Lavina Hamman M.D. Anesthesia: Epidural   Findings: Patient had normal gravid anatomy and delivered a viable female infant with Apgars of 9 and 9 weight pending, cleft lip and palate Estimated blood loss: 800 cc Specimens: Placenta sent to labor and delivery Complications: None  Procedure in detail: The patient was taken to the operating room and placed in the dorsodupine position.  Her previously placed epidural was dosed appropriately.  Abdomen was then prepped and draped in the usual sterile fashion, a foley catheter had previously been inserted. The level of her anesthesia was found to be adequate. Abdomen was entered via a standard Pfannenstiel incision. Once the peritoneal cavity was entered the Alexis disposable self-retaining retractor was placed and good visualization was achieved. A 4 cm transverse incision was then made in the lower uterine segment pushing the bladder inferior. Once the uterine cavity was entered the incision was extended digitally. The fetal vertex was grasped and delivered through the incision atraumatically. Mouth and nares were suctioned. The remainder of the infant then delivered atraumatically. Cord was doubly clamped and cut and the infant handed to the awaiting pediatric team. Cord blood was obtained. The placenta delivered spontaneously. Uterus was wiped dry with clean lap pad and all clots and debris were removed. Uterine incision was inspected and found to be free of extensions. Uterine incision was closed in 2 layer with running locking #1 Chromic for the first layer, an imbricating layer of #1 Chromic for the second layer.  Bleeding from the right angle was controlled with 3-0 Vicryl. Tubes and ovaries were inspected and found to be normal. Uterine  incision was inspected and found to be hemostatic. Bleeding from serosal edges was controlled with electrocautery. The Alexis retractor was removed. Subfascial space was irrigated and made hemostatic with electrocautery. Fascia was closed in running fashion starting at both ends and meeting in the middle with 0 Vicryl. Subcutaneous tissue was then irrigated and made hemostatic with electrocautery. Skin was closed with staples followed by a sterile dressing. Patient tolerated the procedure well and was taken to the recovery in stable condition. Counts were correct x2, she received Ancef 2 g IV at the beginning of the procedure and she had PAS hose on throughout the procedure.

## 2012-10-03 NOTE — MAU Note (Signed)
SAYS HURT BAD  WED NIGHT . VE IN OFFICE  1 CM      DENIES HSV AND MRSA.

## 2012-10-04 LAB — CBC
HCT: 20.5 % — ABNORMAL LOW (ref 36.0–46.0)
HCT: 19.7 % — ABNORMAL LOW (ref 36.0–46.0)
Hemoglobin: 6.4 g/dL — CL (ref 12.0–15.0)
Hemoglobin: 6.1 g/dL — CL (ref 12.0–15.0)
MCH: 23.5 pg — ABNORMAL LOW (ref 26.0–34.0)
MCHC: 30.7 g/dL (ref 30.0–36.0)
MCV: 76.5 fL — ABNORMAL LOW (ref 78.0–100.0)
MCV: 77 fL — ABNORMAL LOW (ref 78.0–100.0)
RBC: 2.56 MIL/uL — ABNORMAL LOW (ref 3.87–5.11)
WBC: 11.5 10*3/uL — ABNORMAL HIGH (ref 4.0–10.5)

## 2012-10-04 NOTE — Progress Notes (Signed)
Called Dr. Jackelyn Knife to report critical hemoglobin lab value of 6.4 and approximately 6x4-inch raised, red, warm to the touch area on patient's back. Reported that patient stated the area painful without itching. Meisinger stated that he would examine the area. No new orders. Reported all to oncoming nurse.

## 2012-10-04 NOTE — Anesthesia Postprocedure Evaluation (Signed)
  Anesthesia Post-op Note  Patient: Pamela Hoover  Procedure(s) Performed: Procedure(s): CESAREAN SECTION (N/A)  Patient Location: Mother/Baby  Anesthesia Type:Epidural  Level of Consciousness: awake, alert  and oriented  Airway and Oxygen Therapy: Patient Spontanous Breathing  Post-op Pain: mild  Post-op Assessment: Post-op Vital signs reviewed, Patient's Cardiovascular Status Stable, Respiratory Function Stable, Patent Airway, No signs of Nausea or vomiting, Adequate PO intake and Pain level controlled  Post-op Vital Signs: stable  Complications: No apparent anesthesia complications

## 2012-10-04 NOTE — Progress Notes (Signed)
Subjective: Postpartum Day #1: Cesarean Delivery Patient reports incisional pain and tolerating PO.  Painful, itchy area on back   Objective: Vital signs in last 24 hours: Temp:  [98.4 F (36.9 C)-100.8 F (38.2 C)] 98.7 F (37.1 C) (02/08 0647) Pulse Rate:  [83-115] 95 (02/08 0647) Resp:  [15-20] 18 (02/08 0647) BP: (94-126)/(44-72) 95/62 mmHg (02/08 0647) SpO2:  [97 %-100 %] 99 % (02/08 0647)  Physical Exam:  General: alert Lochia: appropriate Uterine Fundus: firm Incision: healing well Back:  Has 10x 10cm area on right mid back with erythema and some dark discoloration in the middle that does not wipe off   Recent Labs  10/03/12 0116 10/04/12 0544  HGB 10.0* 6.4*  HCT 31.9* 20.5*    Assessment/Plan: Status post Cesarean section. Doing well postoperatively.  I am not sure as to the etiology of the area of erythema on her back.  I suspect drop in Hgb is due to underestimated EBL at surgery. Continue current care, ambulate, d/c foley, recheck CBC at 1200, cool cloth or ice to area on back prn  Davonna Ertl D 10/04/2012, 8:49 AM

## 2012-10-05 NOTE — Progress Notes (Signed)
POD #2 LTCS Doing ok, area on back is still sore Afeb, VSS Abd- soft, fundus firm, incision intact Back- area of erythema is stable, maybe a little smaller Hgb stable yesterday at 6.1 Continue routine care.  Area of erythema on back may be from epidural dressing/tape

## 2012-10-06 ENCOUNTER — Encounter (HOSPITAL_COMMUNITY): Payer: Self-pay | Admitting: Obstetrics and Gynecology

## 2012-10-06 MED ORDER — OXYCODONE-ACETAMINOPHEN 5-325 MG PO TABS: 1.0000 | ORAL_TABLET | ORAL | Status: DC | PRN

## 2012-10-06 MED ORDER — IBUPROFEN 600 MG PO TABS: 600.0000 mg | ORAL_TABLET | Freq: Four times a day (QID) | ORAL | Status: DC | PRN

## 2012-10-06 NOTE — Discharge Summary (Signed)
Obstetric Discharge Summary Reason for Admission: onset of labor Prenatal Procedures: none Intrapartum Procedures: cesarean: low cervical, transverse Postpartum Procedures: none Complications-Operative and Postpartum: none Hemoglobin  Date Value Range Status  10/04/2012 6.1* 12.0 - 15.0 g/dL Final     REPEATED TO VERIFY     CONSISTENT WITH PREVIOUS RESULT     CRITICAL RESULT CALLED TO, READ BACK BY AND VERIFIED WITH:     DONNA ESKER RN.@1225  ON 10/04/12 BY CALDWELL T     HCT  Date Value Range Status  10/04/2012 19.7* 36.0 - 46.0 % Final    Physical Exam:  General: alert Lochia: appropriate Uterine Fundus: firm Incision: healing well  Discharge Diagnoses: Term Pregnancy-delivered and fetal cleft lip and palate  Discharge Information: Date: 10/06/2012 Activity: pelvic rest and no strenuous activity Diet: routine Medications: Ibuprofen and Percocet Condition: stable Instructions: refer to practice specific booklet Discharge to: home Follow-up Information   Follow up with Pamela Rawdon D, MD. Schedule an appointment as soon as possible for a visit in 2 weeks.   Contact information:   [address], [address] [place] [address] [phone]       Newborn Data: Live born female  Birth Weight: 7 lb 0.2 oz (3180 g) APGAR: 9, 9  Home with mother.  Pamela Hoover 10/06/2012, 10:38 AM

## 2012-10-06 NOTE — Progress Notes (Signed)
Ur chart review completed.  

## 2012-10-06 NOTE — Progress Notes (Signed)
POD #3 Doing ok Afeb, VSS Abd- soft, fundus firm, incision intact Back- area of erythema is smaller, now appears like an abrasion D/c home

## 2012-10-07 ENCOUNTER — Inpatient Hospital Stay (HOSPITAL_COMMUNITY): Admission: RE | Admit: 2012-10-07 | Payer: Medicaid Other | Source: Ambulatory Visit

## 2012-10-12 ENCOUNTER — Encounter (HOSPITAL_COMMUNITY): Payer: Self-pay | Admitting: *Deleted

## 2012-10-12 ENCOUNTER — Inpatient Hospital Stay (HOSPITAL_COMMUNITY)
Admission: AD | Admit: 2012-10-12 | Discharge: 2012-10-12 | Disposition: A | Discharge status: Home / Self Care | Payer: Medicaid Other | Source: Ambulatory Visit | Attending: Obstetrics and Gynecology | Admitting: Obstetrics and Gynecology

## 2012-10-12 DIAGNOSIS — T8149XA Infection following a procedure, other surgical site, initial encounter: Secondary | ICD-10-CM

## 2012-10-12 DIAGNOSIS — T8140XA Infection following a procedure, unspecified, initial encounter: Secondary | ICD-10-CM

## 2012-10-12 DIAGNOSIS — O909 Complication of the puerperium, unspecified: Secondary | ICD-10-CM | POA: Insufficient documentation

## 2012-10-12 MED ORDER — SULFAMETHOXAZOLE-TRIMETHOPRIM 800-160 MG PO TABS: 1.0000 | ORAL_TABLET | Freq: Two times a day (BID) | ORAL | Status: DC

## 2012-10-12 NOTE — Progress Notes (Signed)
Written and verbal d/c instructions given and understanding voiced. 

## 2012-10-12 NOTE — MAU Provider Note (Signed)
  History     CSN: [id number]  Arrival date and time: 10/12/12 0300   First Provider Initiated Contact with Patient 10/12/12 [phone]      Chief Complaint  Patient presents with  . Drainage from Incision   HPI  Pt is a G1P1001 at 9 days postpartum s/p a csection for a arrest of dilatation.  Pt here with report of incisional discharge that started yesterday, yellow. No report of fever, body aches, or chills.    Past Medical History  Diagnosis Date  . Anemia   . No pertinent past medical history     Past Surgical History  Procedure Laterality Date  . No past surgeries    . Cesarean section N/A 10/03/2012    Procedure: CESAREAN SECTION;  Surgeon: Lavina Hamman, MD;  Location: [facility];  Service: Obstetrics;  Laterality: N/A;    Family History  Problem Relation Age of Onset  . Diabetes Maternal Aunt     History  Substance Use Topics  . Smoking status: Never Smoker   . Smokeless tobacco: Never Used  . Alcohol Use: No    Allergies: No Known Allergies  Prescriptions prior to admission  Medication Sig Dispense Refill  . ibuprofen (ADVIL,MOTRIN) 600 MG tablet Take 1 tablet (600 mg total) by mouth every 6 (six) hours as needed.  30 tablet  1  . IRON PO Take 1 tablet by mouth 2 (two) times daily.      Marland Kitchen oxyCODONE-acetaminophen (PERCOCET/ROXICET) 5-325 MG per tablet Take 1-2 tablets by mouth every 4 (four) hours as needed.  30 tablet  0    Review of Systems  Constitutional: Negative for fever and chills.  Gastrointestinal: Positive for abdominal pain (incisional pain).   Physical Exam   Blood pressure 104/60, pulse 71, temperature 97.8 F (36.6 C), temperature source Oral, resp. rate 20, last menstrual period 12/30/2011, currently breastfeeding.  Physical Exam  Constitutional: She is oriented to person, place, and time. She appears well-developed and well-nourished.  HENT:  Head: Normocephalic.  Neck: Normal range of motion. Neck supple.  GI: Soft. There is no tenderness.  There is no guarding.  Incision site with 3 cm area of hardening, when probed with swab, foul-smelling discharge drains; hardening no longer palpated; slight redness around area  Genitourinary: There is bleeding (scant) around the vagina.  Neurological: She is alert and oriented to person, place, and time. She has normal reflexes.  Skin: Skin is warm and dry.    MAU Course  Procedures Incision cleaned with normal saline; iodoform placed within incision  Dr. Ellyn Hack called > RX Bactrim and have pt follow-up in office on Monday.   Assessment and Plan  Incision Infection  Plan: RX Bactrim Follow-up in office on Monday.    [facility] 10/12/2012, 4:19 AM

## 2012-10-12 NOTE — MAU Note (Signed)
Incision edges approximated. Some steristrips off. Slight redness to L portion of incison. Was tender to touch at area and some firmness noted. No current drainage. Pt states she cleaned up the drainage from incision.

## 2012-10-12 NOTE — MAU Note (Signed)
Had C/S on 2/7 for failure to progress. On Saturday noticed yellowish drainage from incision with some odor to drainage.

## 2013-01-17 IMAGING — US US OB DETAIL+14 WK
1 series · 12 of 28 positions shown · non-contrast
Comparison: none

[Series 1: us ob detail+14 wk · 0.23mm/px · 12 of 90 slices shown]
[im 4/90]
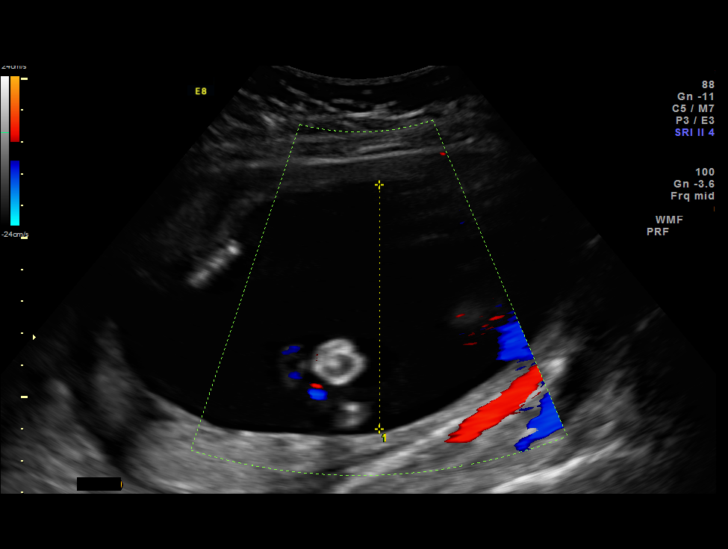
[im 10/90]
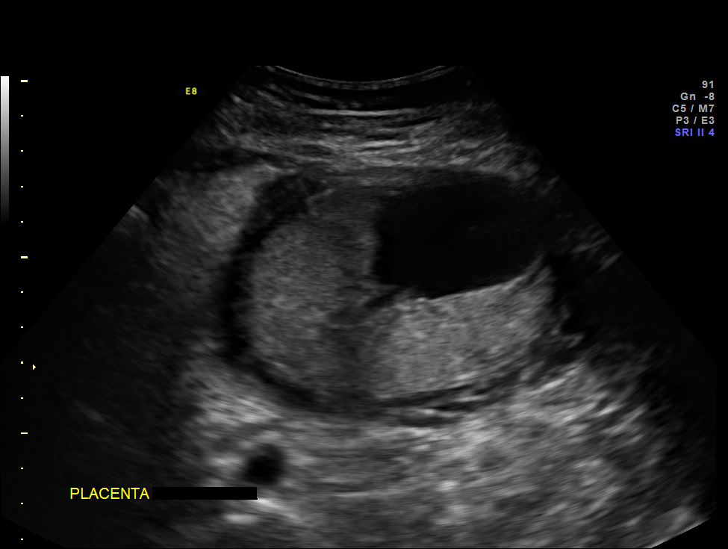
[im 17/90]
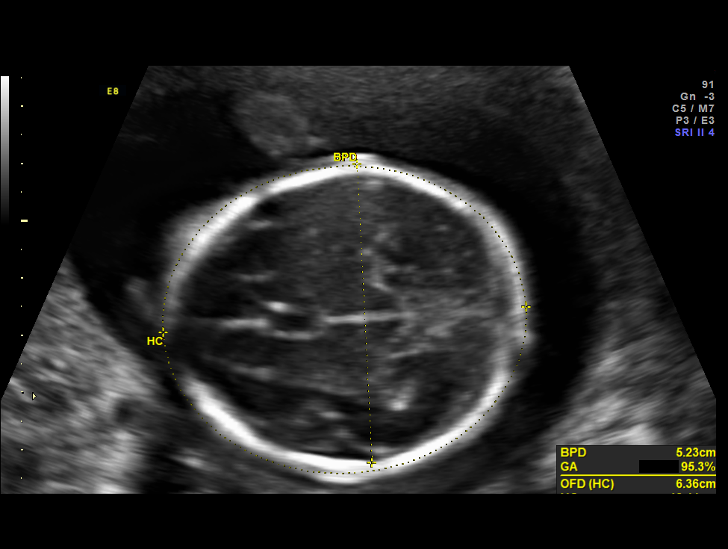
[im 27/90]
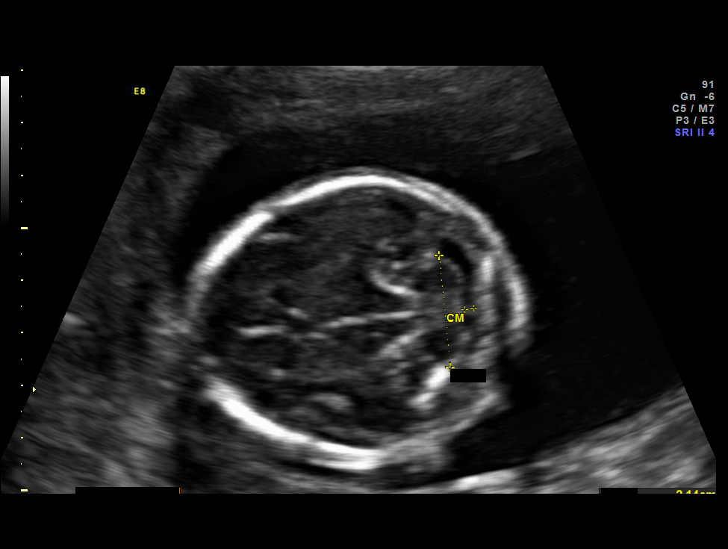
[im 33/90]
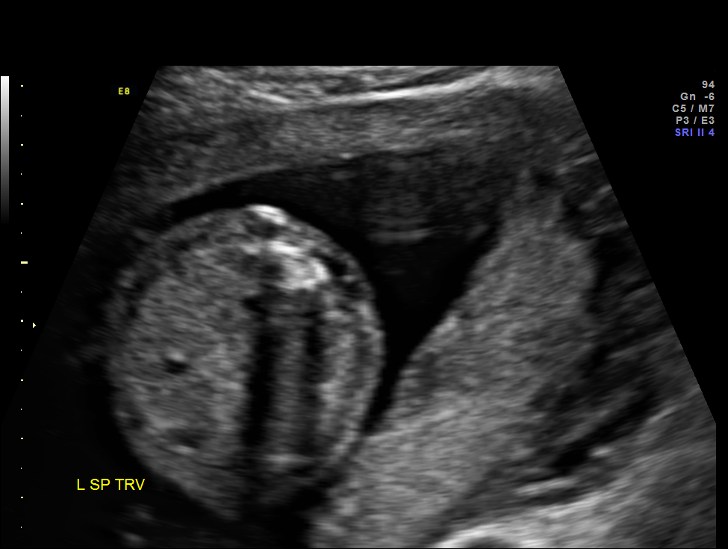
[im 40/90]
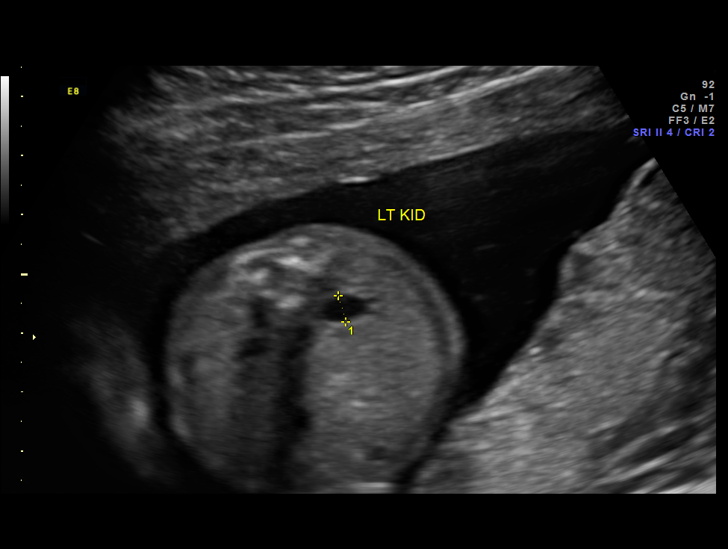
[im 50/90]
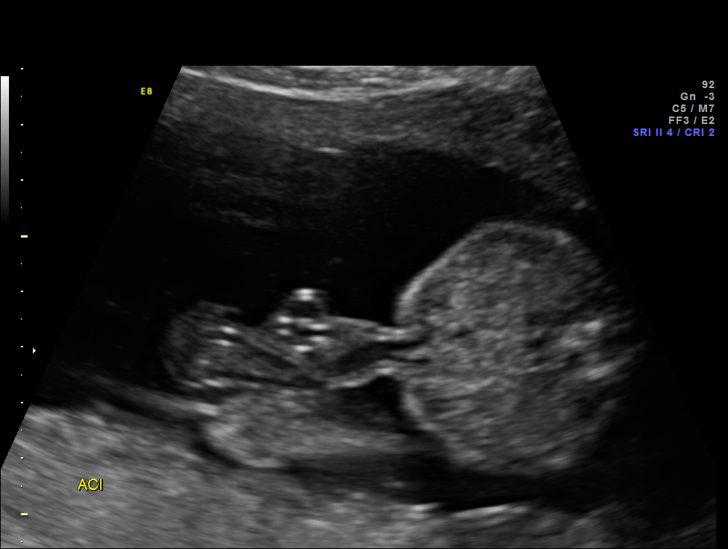
[im 57/90]
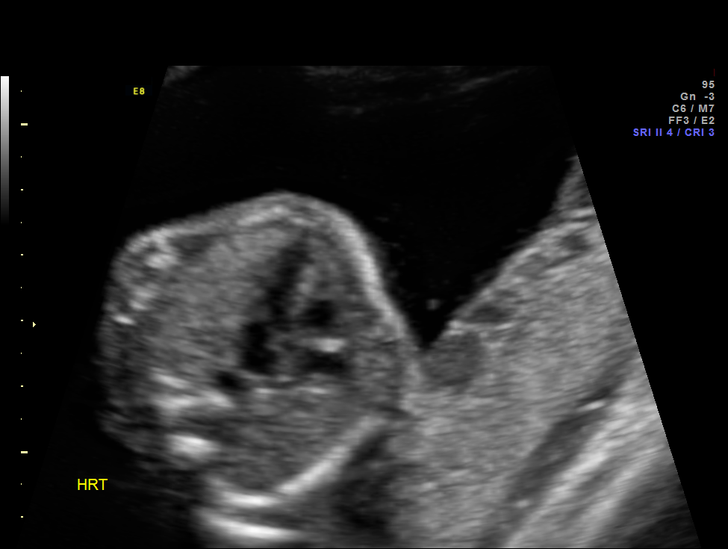
[im 63/90]
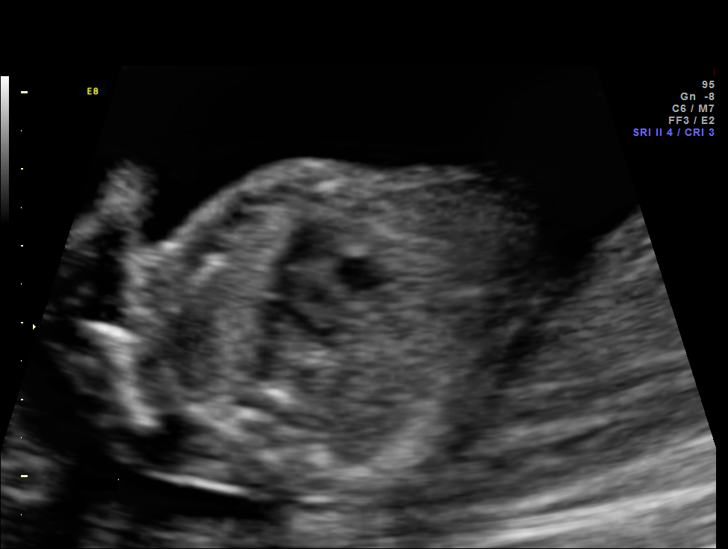
[im 73/90]
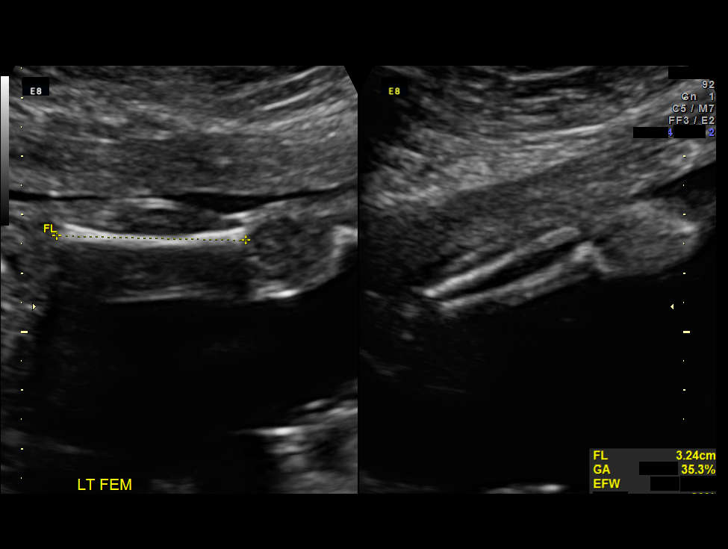
[im 80/90]
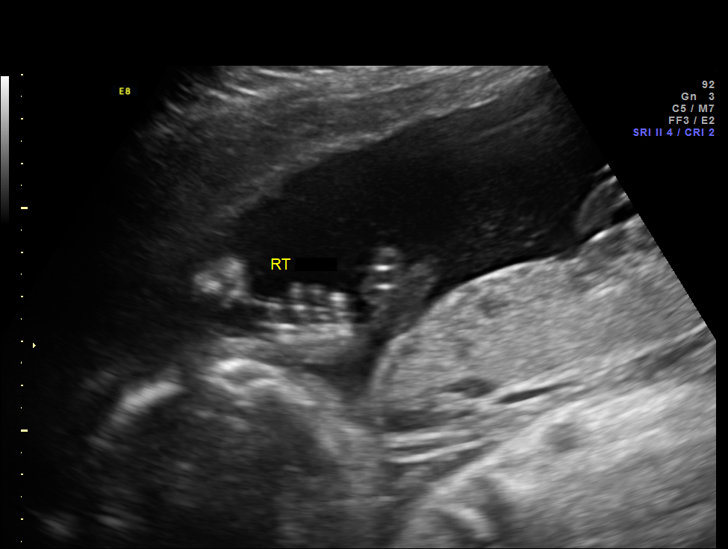
[im 86/90]
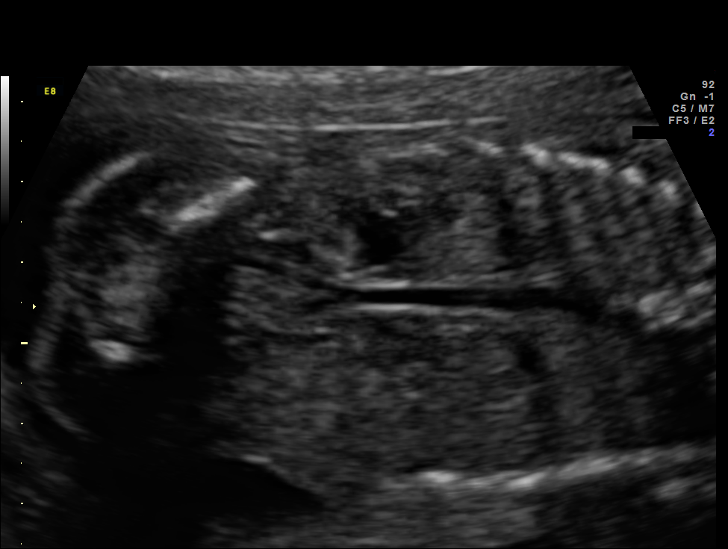

[12 of 28 positions shown; findings below may reference images not displayed]

OBSTETRICS REPORT
                      (Signed Final 05/20/2012 [DATE])

Service(s) Provided

 US OB DETAIL + 14 WK                                  76811.0
Indications

 Detailed fetal anatomic survey
 Cleft lip, unilateral (left)
 Pyelectasis (left)
 Anemia
Fetal Evaluation

 Num Of Fetuses:    1
 Fetal Heart Rate:  148                          bpm
 Cardiac Activity:  Observed
 Presentation:      Cephalic
 Placenta:          Posterior Fundal, above
                    cervical os
 P. Cord            Visualized, central
 Insertion:

 Amniotic Fluid
 AFI FV:      Subjectively within normal limits
                                             Larg Pckt:     7.6  cm
Biometry

 BPD:     52.4  mm     G. Age:  21w 6d                CI:         82.4   70 - 86
 OFD:     63.6  mm                                    FL/HC:      17.9   16.8 -

 HC:     185.5  mm     G. Age:  20w 6d       69  %    HC/AC:      1.11   1.09 -

 AC:     166.9  mm     G. Age:  21w 5d       85  %    FL/BPD:
 FL:      33.2  mm     G. Age:  20w 3d       46  %    FL/AC:      19.9   20 - 24
 HUM:     35.1  mm     G. Age:  22w 0d     > 95  %
 CER:     21.4  mm     G. Age:  20w 3d       52  %

 Est. FW:     401  gm    0 lb 14 oz      59  %
Gestational Age

 LMP:           20w 2d        Date:  12/30/11                 EDD:   10/05/12
 U/S Today:     21w 2d                                        EDD:   09/28/12
 Best:          20w 2d     Det. By:  LMP  (12/30/11)          EDD:   10/05/12
Anatomy

 Cranium:          Appears normal         Aortic Arch:      Appears normal
 Fetal Cavum:      Appears normal         Ductal Arch:      Appears normal
 Ventricles:       Appears normal         Diaphragm:        Appears normal
 Choroid Plexus:   Appears normal         Stomach:          Appears normal
 Cerebellum:       Appears normal         Abdomen:          Appears normal
 Posterior Fossa:  Appears normal         Abdominal Wall:   Appears nml (cord
                                                            insert, abd wall)
 Nuchal Fold:      Not applicable (>20    Cord Vessels:     Appears normal (3
                   wks GA)                                  vessel cord)
 Face:             Appears normal         Kidneys:          Left sided
                   (orbits and profile)
                                                            pyelectasis, 4 mm
 Lips:             Unilateral left cleft  Bladder:          Appears normal
                   lip
 Palate:           Cleft palate           Spine:            Appears normal
                   suspected
 Heart:            Appears normal         Lower             Appears normal
                   (4CH, axis, and        Extremities:
                   situs)
 RVOT:             Appears normal         Upper             Appears normal
                                          Extremities:
 LVOT:             Appears normal

 Other:  Fetus appears to be a male. Heels and 5th digit visualized. Nasal
         bone visualized.
Targeted Anatomy

 Fetal Central Nervous System
 Cisterna Magna:
Cervix Uterus Adnexa

 Cervical Length:    3.8      cm

 Cervix:       Normal appearance by transabdominal scan.

 Left Ovary:    Within normal limits.
 Right Ovary:   Within normal limits.
 Adnexa:     No abnormality visualized.
Impression

 IUP at 20+2 weeks
 Left-sided cleft lip and palate
 All other detailed fetal anatomy was seen and appeared
 normal
 Markers of aneuploidy: mild pyelectasis on left
 Normal amniotic fluid volume
 Measurements consistent with LMP dating

 The US findings were shared with Ms. Tambra and her
 husband. She also met with our genetic counselor, Kenisha
 Saicy. All of their prenatal testing and pregnancy
 management options were reviewed. After careful
 consideration, she declined amniocentesis, fetal ECHO and
 prenatal consultation with a pediatric plastic surgeon.
Recommendations
 Follow-up ultrasound in 6 weeks to reassess anatomy

 questions or concerns.

## 2013-04-11 IMAGING — US US OB FOLLOW-UP
1 series · 12 of 28 positions shown · non-contrast
Comparison: none

[Series 1: us ob follow-up · 0.26mm/px · 12 of 43 slices shown]
[im 2/43]
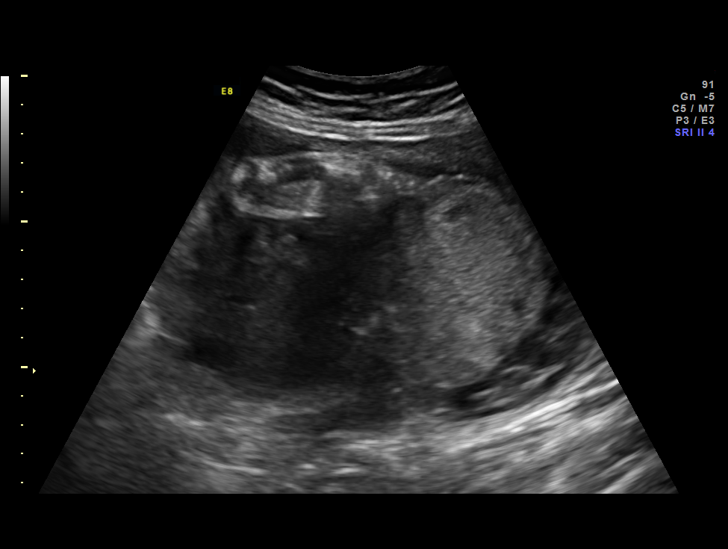
[im 5/43]
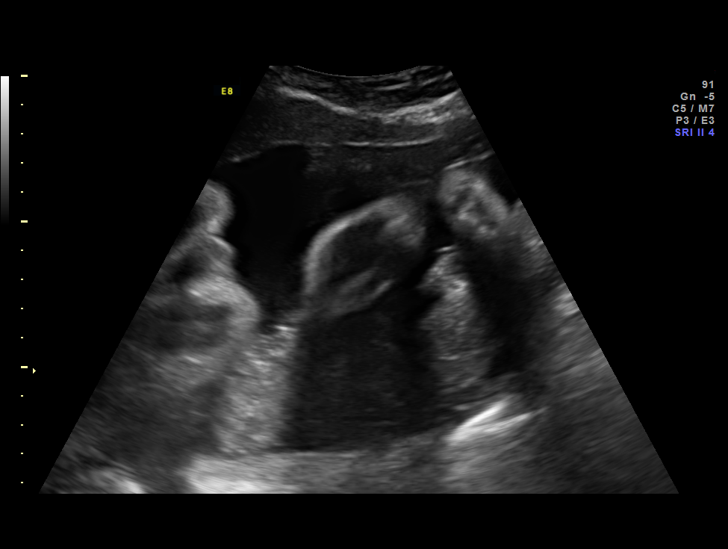
[im 8/43]
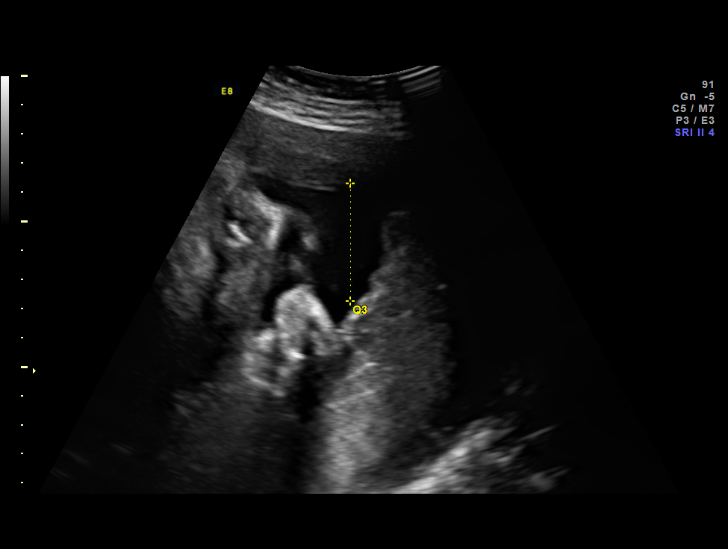
[im 13/43]
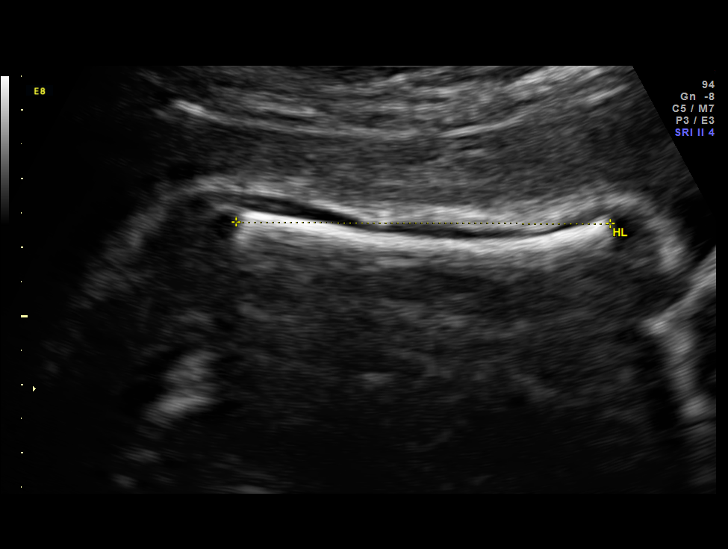
[im 16/43]
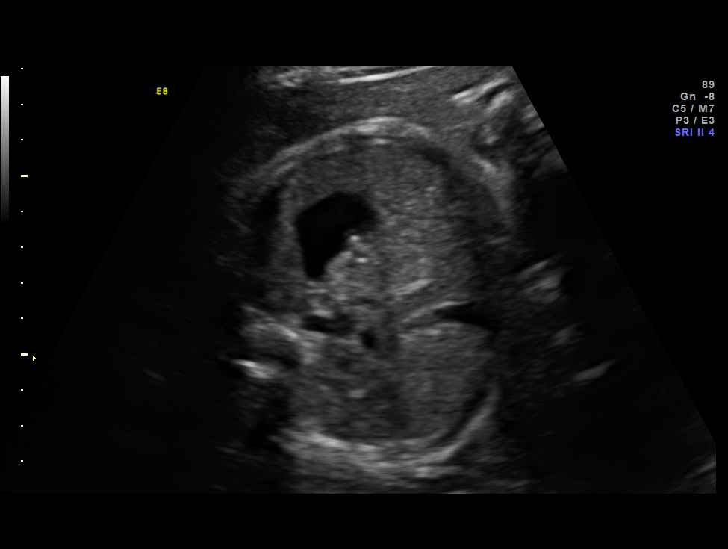
[im 19/43]
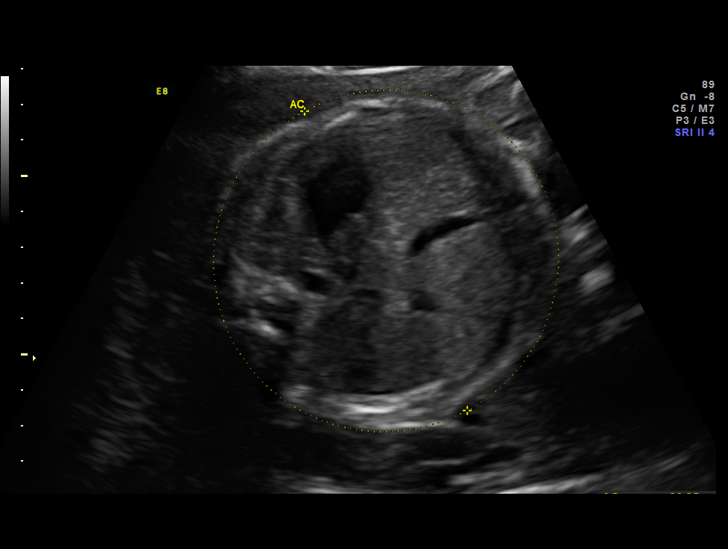
[im 24/43]
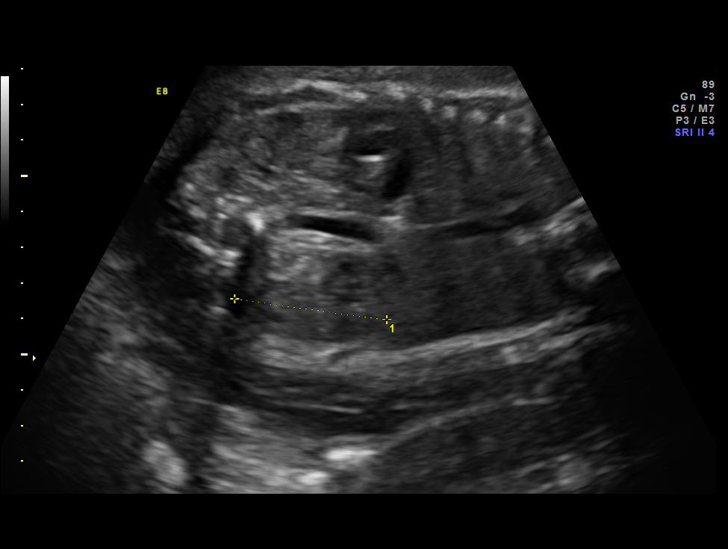
[im 27/43]
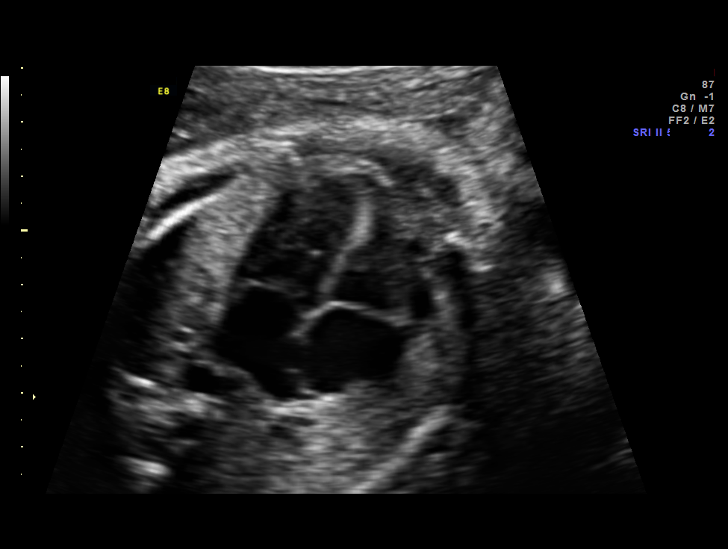
[im 30/43]
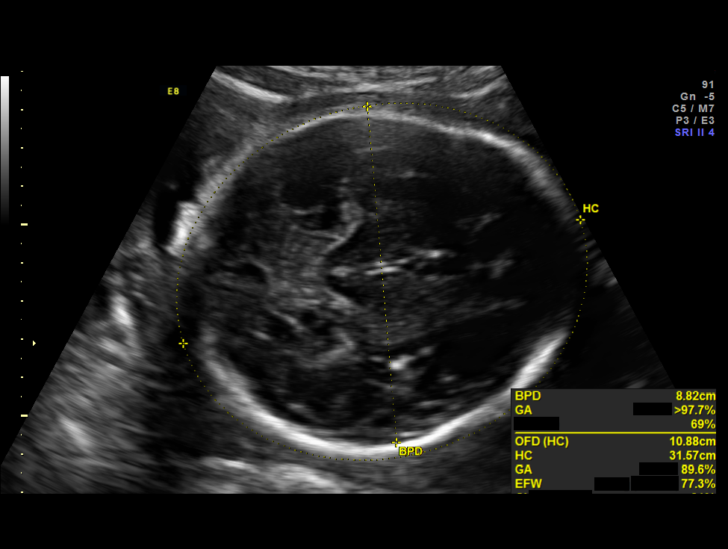
[im 35/43]
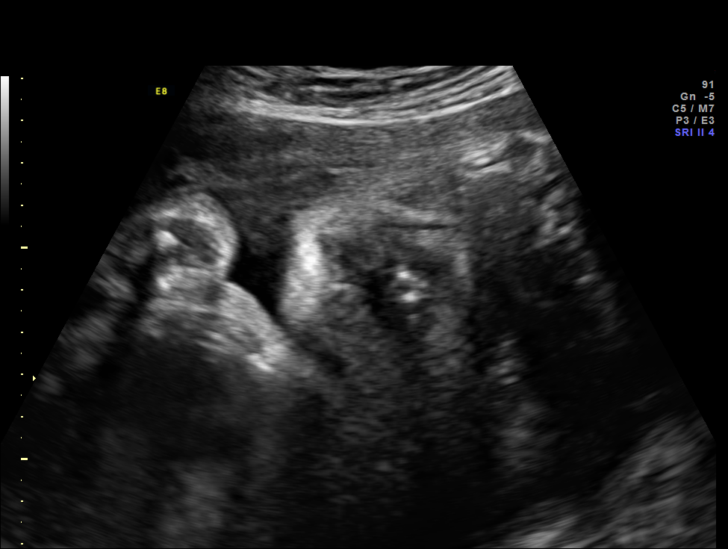
[im 38/43]
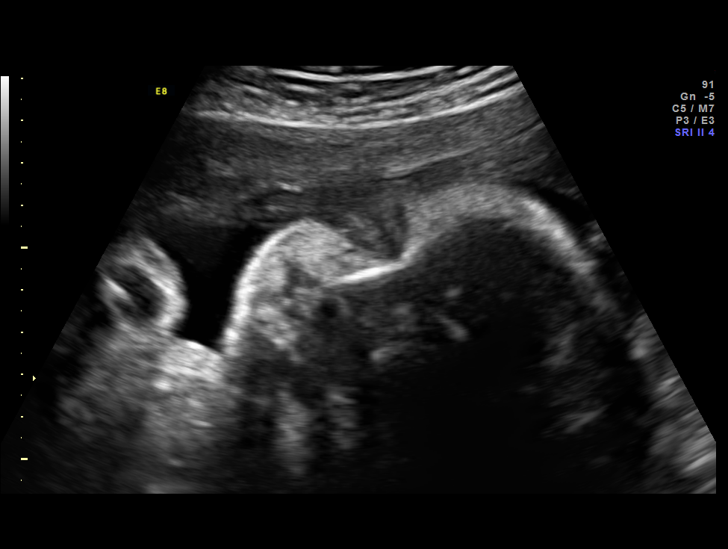
[im 41/43]
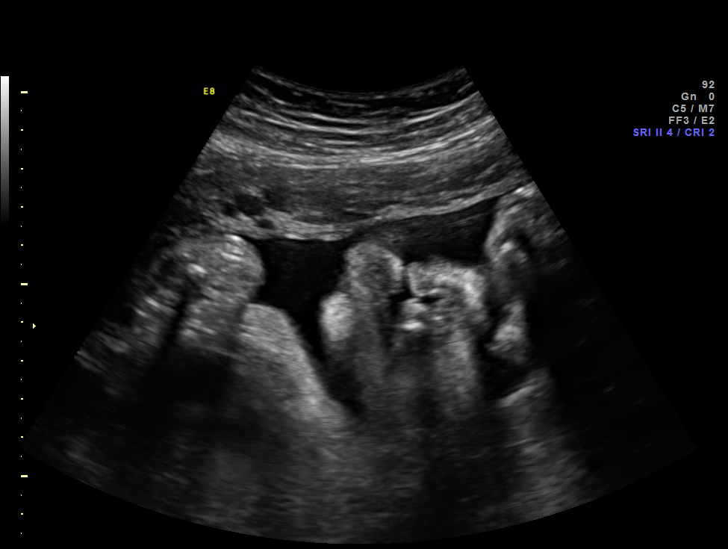

[12 of 28 positions shown; findings below may reference images not displayed]

OBSTETRICS REPORT
                      (Signed Final 08/12/2012 [DATE])

Service(s) Provided

 US OB FOLLOW UP                                       76816.1
Indications

 Cleft lip, unilateral (left)
Fetal Evaluation

 Num Of Fetuses:    1
 Fetal Heart Rate:  145                          bpm
 Cardiac Activity:  Observed
 Presentation:      Cephalic
 Placenta:          Posterior, above cervical
                    os
 P. Cord            Previously Visualized
 Insertion:

 Amniotic Fluid
 AFI FV:      Subjectively within normal limits
 AFI Sum:     12.43   cm       35  %Tile     Larg Pckt:    4.05  cm
 RUQ:   3.25    cm   RLQ:    1.95   cm    LUQ:   3.18    cm   LLQ:    4.05   cm
Biometry

 BPD:     87.2  mm     G. Age:  35w 1d                CI:         78.6   70 - 86
 OFD:    110.9  mm                                    FL/HC:      19.2   19.1 -

 HC:     315.4  mm     G. Age:  35w 3d       89  %    HC/AC:      1.04   0.96 -

 AC:     303.2  mm     G. Age:  34w 2d       93  %    FL/BPD:     69.5   71 - 87
 FL:      60.6  mm     G. Age:  31w 4d       19  %    FL/AC:      20.0   20 - 24
 HUM:     54.8  mm     G. Age:  31w 6d       44  %

 Est. FW:    2248  gm           5 lb     79  %
Gestational Age

 LMP:           32w 2d        Date:  12/30/11                 EDD:   10/05/12
 U/S Today:     34w 1d                                        EDD:   09/22/12
 Best:          32w 2d     Det. By:  LMP  (12/30/11)          EDD:   10/05/12
Anatomy
 Cranium:          Previously seen        Aortic Arch:      Previously seen
 Fetal Cavum:      Previously seen        Ductal Arch:      Previously seen
 Ventricles:       Appears normal         Diaphragm:        Appears normal
 Choroid Plexus:   Previously seen        Stomach:          Appears normal, left
                                                            sided
 Cerebellum:       Previously seen        Abdomen:          Previously seen
 Posterior Fossa:  Previously seen        Abdominal Wall:   Previously seen
 Nuchal Fold:      Not applicable (>20    Cord Vessels:     Previously seen
                   wks GA)
 Face:             Orbits and profile     Kidneys:          Appear normal
                   previously seen
 Lips:             Unilateral left cleft  Bladder:          Appears normal
                   lip
 Palate:           Cleft palate           Spine:            Previously seen
                   suspected
 Heart:            Appears normal         Lower             Previously seen
                   (4CH, axis, and        Extremities:
                   situs)
 RVOT:             Previously seen        Upper             Previously seen
                                          Extremities:
 LVOT:             Appears normal

 Other:  Fetus appears to be a male. Heels and 5th digit previously seen.
         Nasal bone previously seen.
Cervix Uterus Adnexa

 Cervix:       Not visualized (advanced GA >40wks)
Impression

 IUP at 32+2 weeks
 Unilateral cleft lip and probable palate; appeared to be an
 isolated defect
 All other interval fetal anatomy was seen and appeared
 normal
 Normal amniotic fluid volume
 Appropriate interval growth with EFW at the 79th %tile

 Ms. Klever would like to meet with a pediatric plastic surgeon
 for prenatal consultation. She would like the surgery to be
 performed at [REDACTED].
Recommendations

 Routine labor and delivery
 Will arrange for prenatal consultation with a pediatric plastic
 surgeon (through [facility] at [REDACTED]  [phone])

 questions or concerns.

## 2014-02-10 LAB — OB RESULTS CONSOLE ABO/RH: RH TYPE: POSITIVE

## 2014-02-10 LAB — OB RESULTS CONSOLE RUBELLA ANTIBODY, IGM: Rubella: IMMUNE

## 2014-02-10 LAB — OB RESULTS CONSOLE RPR: RPR: NONREACTIVE

## 2014-02-10 LAB — OB RESULTS CONSOLE GC/CHLAMYDIA
Chlamydia: NEGATIVE
Gonorrhea: NEGATIVE

## 2014-02-10 LAB — OB RESULTS CONSOLE ANTIBODY SCREEN: ANTIBODY SCREEN: NEGATIVE

## 2014-02-10 LAB — OB RESULTS CONSOLE HEPATITIS B SURFACE ANTIGEN: HEP B S AG: NEGATIVE

## 2014-02-10 LAB — OB RESULTS CONSOLE HIV ANTIBODY (ROUTINE TESTING): HIV: NONREACTIVE

## 2014-06-28 ENCOUNTER — Encounter (HOSPITAL_COMMUNITY): Payer: Self-pay | Admitting: *Deleted

## 2014-07-27 LAB — OB RESULTS CONSOLE GBS: GBS: POSITIVE

## 2014-08-18 ENCOUNTER — Inpatient Hospital Stay (HOSPITAL_COMMUNITY): Payer: Medicaid Other | Admitting: Anesthesiology

## 2014-08-18 ENCOUNTER — Inpatient Hospital Stay (HOSPITAL_COMMUNITY)
Admission: AD | Admit: 2014-08-18 | Discharge: 2014-08-20 | DRG: 775 | Disposition: A | Discharge status: Home / Self Care | Payer: Medicaid Other | Source: Ambulatory Visit | Attending: Obstetrics and Gynecology | Admitting: Obstetrics and Gynecology

## 2014-08-18 ENCOUNTER — Encounter (HOSPITAL_COMMUNITY): Payer: Self-pay | Admitting: *Deleted

## 2014-08-18 DIAGNOSIS — Z3A38 38 weeks gestation of pregnancy: Secondary | ICD-10-CM | POA: Diagnosis present

## 2014-08-18 DIAGNOSIS — O3421 Maternal care for scar from previous cesarean delivery: Secondary | ICD-10-CM | POA: Diagnosis present

## 2014-08-18 DIAGNOSIS — Z8759 Personal history of other complications of pregnancy, childbirth and the puerperium: Secondary | ICD-10-CM

## 2014-08-18 DIAGNOSIS — O99824 Streptococcus B carrier state complicating childbirth: Secondary | ICD-10-CM | POA: Diagnosis present

## 2014-08-18 DIAGNOSIS — IMO0001 Reserved for inherently not codable concepts without codable children: Secondary | ICD-10-CM

## 2014-08-18 LAB — RPR

## 2014-08-18 LAB — TYPE AND SCREEN
ABO/RH(D): O POS
Antibody Screen: NEGATIVE

## 2014-08-18 LAB — CBC
HCT: 32.6 % — ABNORMAL LOW (ref 36.0–46.0)
HEMOGLOBIN: 10.7 g/dL — AB (ref 12.0–15.0)
MCH: 27.8 pg (ref 26.0–34.0)
MCHC: 32.8 g/dL (ref 30.0–36.0)
MCV: 84.7 fL (ref 78.0–100.0)
PLATELETS: 126 10*3/uL — AB (ref 150–400)
RBC: 3.85 MIL/uL — AB (ref 3.87–5.11)
RDW: 19.1 % — ABNORMAL HIGH (ref 11.5–15.5)
WBC: 11.1 10*3/uL — ABNORMAL HIGH (ref 4.0–10.5)

## 2014-08-18 MED ORDER — ZOLPIDEM TARTRATE 5 MG PO TABS: 5.0000 mg | ORAL_TABLET | Freq: Every evening | ORAL | Status: DC | PRN

## 2014-08-18 MED ORDER — TERBUTALINE SULFATE 1 MG/ML IJ SOLN: 0.2500 mg | Freq: Once | INTRAMUSCULAR | Status: DC | PRN

## 2014-08-18 MED ORDER — PENICILLIN G POTASSIUM 5000000 UNITS IJ SOLR
5.0000 10*6.[IU] | Freq: Once | INTRAVENOUS | Status: AC
  Administered 2014-08-18: 5 10*6.[IU] via INTRAVENOUS
  Filled 2014-08-18: qty 5

## 2014-08-18 MED ORDER — CITRIC ACID-SODIUM CITRATE 334-500 MG/5ML PO SOLN
30.0000 mL | ORAL | Status: DC | PRN
  Filled 2014-08-18: qty 15

## 2014-08-18 MED ORDER — DIPHENHYDRAMINE HCL 25 MG PO CAPS
25.0000 mg | ORAL_CAPSULE | Freq: Four times a day (QID) | ORAL | Status: DC | PRN
Start: 2014-08-18 — End: 2014-08-20

## 2014-08-18 MED ORDER — LACTATED RINGERS IV SOLN
INTRAVENOUS | Status: AC
  Administered 2014-08-18 – 2014-08-19 (×2): via INTRAVENOUS

## 2014-08-18 MED ORDER — FENTANYL 2.5 MCG/ML BUPIVACAINE 1/10 % EPIDURAL INFUSION (WH - ANES)
INTRAMUSCULAR | Status: DC | PRN
  Administered 2014-08-18: 14 mL/h via EPIDURAL

## 2014-08-18 MED ORDER — OXYTOCIN BOLUS FROM INFUSION: 500.0000 mL | INTRAVENOUS | Status: DC

## 2014-08-18 MED ORDER — WITCH HAZEL-GLYCERIN EX PADS: 1.0000 "application " | MEDICATED_PAD | CUTANEOUS | Status: DC | PRN

## 2014-08-18 MED ORDER — MISOPROSTOL 200 MCG PO TABS
ORAL_TABLET | ORAL | Status: AC
  Filled 2014-08-18: qty 5

## 2014-08-18 MED ORDER — ONDANSETRON HCL 4 MG/2ML IJ SOLN: 4.0000 mg | INTRAMUSCULAR | Status: DC | PRN

## 2014-08-18 MED ORDER — PENICILLIN G POTASSIUM 5000000 UNITS IJ SOLR
2.5000 10*6.[IU] | INTRAVENOUS | Status: DC
  Administered 2014-08-18 (×3): 2.5 10*6.[IU] via INTRAVENOUS
  Filled 2014-08-18 (×6): qty 2.5

## 2014-08-18 MED ORDER — BENZOCAINE-MENTHOL 20-0.5 % EX AERO
1.0000 "application " | INHALATION_SPRAY | CUTANEOUS | Status: DC | PRN
  Administered 2014-08-19 – 2014-08-20 (×3): 1 via TOPICAL
  Filled 2014-08-18 (×4): qty 56

## 2014-08-18 MED ORDER — DIPHENHYDRAMINE HCL 50 MG/ML IJ SOLN: 12.5000 mg | INTRAMUSCULAR | Status: DC | PRN

## 2014-08-18 MED ORDER — LIDOCAINE HCL (PF) 1 % IJ SOLN
30.0000 mL | INTRAMUSCULAR | Status: DC | PRN
  Administered 2014-08-18: 30 mL via SUBCUTANEOUS
  Filled 2014-08-18: qty 30

## 2014-08-18 MED ORDER — PHENYLEPHRINE 40 MCG/ML (10ML) SYRINGE FOR IV PUSH (FOR BLOOD PRESSURE SUPPORT)
80.0000 ug | PREFILLED_SYRINGE | INTRAVENOUS | Status: DC | PRN
  Filled 2014-08-18: qty 10

## 2014-08-18 MED ORDER — FENTANYL 2.5 MCG/ML BUPIVACAINE 1/10 % EPIDURAL INFUSION (WH - ANES)
14.0000 mL/h | INTRAMUSCULAR | Status: DC | PRN
  Administered 2014-08-18: 14 mL/h via EPIDURAL
  Filled 2014-08-18 (×2): qty 125

## 2014-08-18 MED ORDER — OXYCODONE-ACETAMINOPHEN 5-325 MG PO TABS: 1.0000 | ORAL_TABLET | ORAL | Status: DC | PRN

## 2014-08-18 MED ORDER — LACTATED RINGERS IV SOLN
INTRAVENOUS | Status: DC
Start: 2014-08-18 — End: 2014-08-18
  Administered 2014-08-18 (×2): via INTRAVENOUS

## 2014-08-18 MED ORDER — SIMETHICONE 80 MG PO CHEW: 80.0000 mg | CHEWABLE_TABLET | ORAL | Status: DC | PRN

## 2014-08-18 MED ORDER — ONDANSETRON HCL 4 MG/2ML IJ SOLN: 4.0000 mg | Freq: Four times a day (QID) | INTRAMUSCULAR | Status: DC | PRN

## 2014-08-18 MED ORDER — LACTATED RINGERS IV SOLN
500.0000 mL | INTRAVENOUS | Status: DC | PRN
  Administered 2014-08-18: 500 mL via INTRAVENOUS
  Administered 2014-08-18: 300 mL via INTRAVENOUS
  Administered 2014-08-18: 250 mL via INTRAVENOUS

## 2014-08-18 MED ORDER — TETANUS-DIPHTH-ACELL PERTUSSIS 5-2.5-18.5 LF-MCG/0.5 IM SUSP: 0.5000 mL | Freq: Once | INTRAMUSCULAR | Status: DC

## 2014-08-18 MED ORDER — SENNOSIDES-DOCUSATE SODIUM 8.6-50 MG PO TABS
2.0000 | ORAL_TABLET | ORAL | Status: DC
  Administered 2014-08-19 – 2014-08-20 (×2): 2 via ORAL
  Filled 2014-08-18 (×2): qty 2

## 2014-08-18 MED ORDER — LANOLIN HYDROUS EX OINT: TOPICAL_OINTMENT | CUTANEOUS | Status: DC | PRN

## 2014-08-18 MED ORDER — FLEET ENEMA 7-19 GM/118ML RE ENEM: 1.0000 | ENEMA | Freq: Once | RECTAL | Status: DC

## 2014-08-18 MED ORDER — BUTORPHANOL TARTRATE 1 MG/ML IJ SOLN
1.0000 mg | INTRAMUSCULAR | Status: DC | PRN
Start: 2014-08-18 — End: 2014-08-18

## 2014-08-18 MED ORDER — LIDOCAINE HCL (PF) 1 % IJ SOLN
INTRAMUSCULAR | Status: DC | PRN
  Administered 2014-08-18 (×2): 8 mL

## 2014-08-18 MED ORDER — ACETAMINOPHEN 325 MG PO TABS: 650.0000 mg | ORAL_TABLET | ORAL | Status: DC | PRN

## 2014-08-18 MED ORDER — OXYTOCIN 40 UNITS IN LACTATED RINGERS INFUSION - SIMPLE MED
62.5000 mL/h | INTRAVENOUS | Status: DC
  Administered 2014-08-18 (×2): 62.5 mL/h via INTRAVENOUS

## 2014-08-18 MED ORDER — IBUPROFEN 600 MG PO TABS
600.0000 mg | ORAL_TABLET | Freq: Four times a day (QID) | ORAL | Status: DC
  Administered 2014-08-18 – 2014-08-20 (×7): 600 mg via ORAL
  Filled 2014-08-18 (×6): qty 1

## 2014-08-18 MED ORDER — ONDANSETRON HCL 4 MG PO TABS: 4.0000 mg | ORAL_TABLET | ORAL | Status: DC | PRN

## 2014-08-18 MED ORDER — OXYTOCIN 40 UNITS IN LACTATED RINGERS INFUSION - SIMPLE MED
1.0000 m[IU]/min | INTRAVENOUS | Status: DC
  Administered 2014-08-18: 2 m[IU]/min via INTRAVENOUS
  Filled 2014-08-18: qty 1000

## 2014-08-18 MED ORDER — EPHEDRINE 5 MG/ML INJ: 10.0000 mg | INTRAVENOUS | Status: DC | PRN

## 2014-08-18 MED ORDER — FERROUS SULFATE 325 (65 FE) MG PO TABS
325.0000 mg | ORAL_TABLET | Freq: Two times a day (BID) | ORAL | Status: DC
  Administered 2014-08-19 – 2014-08-20 (×3): 325 mg via ORAL
  Filled 2014-08-18 (×4): qty 1

## 2014-08-18 MED ORDER — PRENATAL MULTIVITAMIN CH
1.0000 | ORAL_TABLET | Freq: Every day | ORAL | Status: DC
  Administered 2014-08-19 – 2014-08-20 (×2): 1 via ORAL
  Filled 2014-08-18 (×3): qty 1

## 2014-08-18 MED ORDER — OXYCODONE-ACETAMINOPHEN 5-325 MG PO TABS: 2.0000 | ORAL_TABLET | ORAL | Status: DC | PRN

## 2014-08-18 MED ORDER — LACTATED RINGERS IV SOLN
500.0000 mL | Freq: Once | INTRAVENOUS | Status: AC
  Administered 2014-08-18: 500 mL via INTRAVENOUS

## 2014-08-18 MED ORDER — MEASLES, MUMPS & RUBELLA VAC ~~LOC~~ INJ: 0.5000 mL | INJECTION | Freq: Once | SUBCUTANEOUS | Status: DC

## 2014-08-18 MED ORDER — PHENYLEPHRINE 40 MCG/ML (10ML) SYRINGE FOR IV PUSH (FOR BLOOD PRESSURE SUPPORT): 80.0000 ug | PREFILLED_SYRINGE | INTRAVENOUS | Status: DC | PRN

## 2014-08-18 MED ORDER — OXYTOCIN 40 UNITS IN LACTATED RINGERS INFUSION - SIMPLE MED
62.5000 mL/h | INTRAVENOUS | Status: AC
  Administered 2014-08-18: 62.5 mL/h via INTRAVENOUS
  Filled 2014-08-18: qty 1000

## 2014-08-18 MED ORDER — OXYCODONE-ACETAMINOPHEN 5-325 MG PO TABS
1.0000 | ORAL_TABLET | ORAL | Status: DC | PRN
  Administered 2014-08-19 – 2014-08-20 (×2): 1 via ORAL
  Filled 2014-08-18 (×3): qty 1

## 2014-08-18 MED ORDER — OXYTOCIN 40 UNITS IN LACTATED RINGERS INFUSION - SIMPLE MED
1.0000 m[IU]/min | INTRAVENOUS | Status: DC
  Administered 2014-08-18: 7 m[IU]/min via INTRAVENOUS

## 2014-08-18 MED ORDER — DIBUCAINE 1 % RE OINT
1.0000 "application " | TOPICAL_OINTMENT | RECTAL | Status: DC | PRN
  Filled 2014-08-18: qty 28

## 2014-08-18 NOTE — Consult Note (Signed)
Neonatology Note:   Attendance at Delivery:   I was asked by Dr. Henley to attend this NSVD at term due to fetal tachycardia and bloody amniotic fluid. The mother is a G3P1 O pos, GBS pos with fetal tachycardia and some FHR decelerations during labor. ROM 12 hours prior to delivery, fluid bloody. Mother got Pen G for 12 hours before delivery and was not febrile during labor. Infant was a little slow to cry, but had normal HR and good tone at birth. Needed only minimal bulb suctioning. Her color improved over the first 2-3 minutes of life without any supplemental O2. Ap 8/9. Lungs clear to ausc in DR. Of note, there were some small clots in the uterus, possible small abruption. To CN to care of Pediatrician.  Lorisa Scheid C. Wanya Bangura, MD 

## 2014-08-18 NOTE — Progress Notes (Signed)
Patient ID: Karie SchwalbeLaila Baldridge, female   DOB: 04/02/1986, 28 y.o.   MRN: [id number] Delivery note:  With continued fetal tachycardia and no fever and the presence of bloody fluid I suspected a partial separation of the placenta so with 6 cm caput showing I advised vacuum assisted delivery and the pt and her husband agreed. The Mity vac bell cup was applied and with one contraction and no pop offs a living female infant was delivered ROA over a second degree irregular laceration and long bilateral labial lacerations. The Apgars were 8 and 9 at 1 and 5 minutes assigned by Dr. Joana Reameravanzo who was in attendance from the NICU,. The placenta was delivered intact and sent to pathology. The uterus was normal and the scar was intact. Rectal was negative. The lacerations were repaired with 3-0 vicryl. EBL 500 cc's.

## 2014-08-18 NOTE — H&P (Signed)
Pamela SchwalbeLaila Hoover is a 28 y.o. female, G2 P1001, EGA 38+ weeks with EDC 1-4 presenting for evaluation of leaking fluid.  Eval in MAU confirmed ROM, pt admitted.  Prenatal care complicated by previous LTCS for fetal distress, CTOL and desires VBAC.  First baby had cleft lip and palate, this baby appears normal on ultrasound.  See prenatal records for complete history.  Maternal Medical History:  Reason for admission: Rupture of membranes.   Fetal activity: Perceived fetal activity is normal.    Prenatal complications: no prenatal complications   OB History    Gravida Para Term Preterm AB TAB SAB Ectopic Multiple Living   3 1 1       1      Past Medical History  Diagnosis Date  . Anemia   . No pertinent past medical history    Past Surgical History  Procedure Laterality Date  . No past surgeries    . Cesarean section N/A 10/03/2012    Procedure: CESAREAN SECTION;  Surgeon: Lavina Hammanodd Santosh Petter, MD;  Location: [facility];  Service: Obstetrics;  Laterality: N/A;   Family History: family history includes Diabetes in her maternal aunt. Social History:  reports that she has never smoked. She has never used smokeless tobacco. She reports that she does not drink alcohol or use illicit drugs.   Prenatal Transfer Tool  Maternal Diabetes: No Genetic Screening: Normal Maternal Ultrasounds/Referrals: Normal Fetal Ultrasounds or other Referrals:  None Maternal Substance Abuse:  No Significant Maternal Medications:  None Significant Maternal Lab Results:  Lab values include: Group B Strep positive Other Comments:  First baby with cleft lip and palate  Review of Systems  Respiratory: Negative.   Cardiovascular: Negative.    AROM forebag-clear Dilation: 5 Effacement (%): 70 Station: -2 Exam by:: Lieutenant Abarca MD Blood pressure 106/67, pulse 89, temperature 97.8 F (36.6 C), temperature source Axillary, resp. rate 18, height 5\' 2"  (1.575 m), weight 74.844 kg (165 lb), SpO2 99 %, currently  breastfeeding. Maternal Exam:  Uterine Assessment: Contraction strength is moderate.  Contraction frequency is irregular.   Abdomen: Patient reports no abdominal tenderness. Surgical scars: low transverse.   Estimated fetal weight is 7 lbs.   Fetal presentation: vertex  Introitus: Normal vulva. Normal vagina.  Ferning test: positive.  Amniotic fluid character: clear.  Pelvis: adequate for delivery.   Cervix: Cervix evaluated by digital exam.     Fetal Exam Fetal Monitor Review: Mode: ultrasound.   Baseline rate: 130.  Variability: moderate (6-25 bpm).   Pattern: early decelerations and variable decelerations.    Fetal State Assessment: Category II - tracings are indeterminate.     Physical Exam  Vitals reviewed. Constitutional: She appears well-developed and well-nourished.  Cardiovascular: Normal rate, regular rhythm and normal heart sounds.   No murmur heard. Respiratory: Breath sounds normal. No respiratory distress. She has no wheezes.  GI: Soft.    Prenatal labs: ABO, Rh: --/--/O POS (12/23 0240) Antibody: NEG (12/23 0240) Rubella: Immune (06/17 0000) RPR: Nonreactive (06/17 0000)  HBsAg: Negative (06/17 0000)  HIV: Non-reactive (06/17 0000)  GBS: Positive (12/01 0000)  GCT:  118  Assessment/Plan: IUP at 38+ weeks with ROM in early labor.  Previous LTCS, CTOL and desires VBAC.  She is on pitocin, has made some progress, comfortable with epidural, on PCN for +GBS, monitor progress.   Cashawn Yanko D 08/18/2014, 6:55 AM

## 2014-08-18 NOTE — Progress Notes (Signed)
Patient ID: Pamela SchwalbeLaila Hoover, female   DOB: 07/15/1986, 28 y.o.   MRN: [id number] With good positioning and pitocin at 7 mu/ minute she has continued to contract. The FHR is much improved and the cervix is 7-8 cm 100% effaced and the vertex is at 0 station. There is bloody show

## 2014-08-18 NOTE — Progress Notes (Signed)
Patient ID: Pamela SchwalbeLaila Hoover, female   DOB: 08/17/1986, 28 y.o.   MRN: [id number] Pt is fully dilated and will be asked to start pushing. The vertex is close to the perineum

## 2014-08-18 NOTE — Anesthesia Preprocedure Evaluation (Signed)
Anesthesia Evaluation  Patient identified by MRN, date of birth, ID band Patient awake    Reviewed: Allergy & Precautions, H&P , NPO status , Patient's Chart, lab work & pertinent test results  Airway Mallampati: I  TM Distance: >3 FB Neck ROM: full    Dental no notable dental hx.    Pulmonary neg pulmonary ROS,    Pulmonary exam normal       Cardiovascular negative cardio ROS      Neuro/Psych negative neurological ROS  negative psych ROS   GI/Hepatic negative GI ROS, Neg liver ROS,   Endo/Other  negative endocrine ROS  Renal/GU negative Renal ROS     Musculoskeletal   Abdominal Normal abdominal exam  (+)   Peds  Hematology   Anesthesia Other Findings   Reproductive/Obstetrics (+) Pregnancy                             Anesthesia Physical Anesthesia Plan  ASA: II  Anesthesia Plan: Epidural   Post-op Pain Management:    Induction:   Airway Management Planned:   Additional Equipment:   Intra-op Plan:   Post-operative Plan:   Informed Consent: I have reviewed the patients History and Physical, chart, labs and discussed the procedure including the risks, benefits and alternatives for the proposed anesthesia with the patient or authorized representative who has indicated his/her understanding and acceptance.     Plan Discussed with:   Anesthesia Plan Comments:         Anesthesia Quick Evaluation

## 2014-08-18 NOTE — Progress Notes (Signed)
Patient ID: Pamela SchwalbeLaila Dominic, female   DOB: 10/21/1985, 28 y.o.   MRN: [id number] FHR decelerations persist so pitocin decreased I will see if this improves the fetal tolerance.O2 begun. Pt is on her left side

## 2014-08-18 NOTE — Progress Notes (Signed)
Patient ID: Pamela SchwalbeLaila Hoover, female   DOB: 11/04/1985, 28 y.o.   MRN: [id number] Pt was fully dilated at 4:43 PM but delayed pushing until her husband arrived. She has not pushed effectively but she has made progress. The FHR has not had significant decelerations but there has been fetal tachycardia for some time The mat`ernal temp is only 99.4 but I suspect there is earl chorioamnionitis.

## 2014-08-18 NOTE — Progress Notes (Signed)
Patient ID: Pamela SchwalbeLaila Hoover, female   DOB: 05/16/1986, 28 y.o.   MRN: [id number] Entire FHR strip reviewed. The FHR has shown variable and late decelerations recently with good variability. She is positioned on her side now and the strip is improved. The pitocin is at 9 mu/ minute and the contractions are q 2-4 minutes. The cervix is 4 cm 80% effaced and the vertex is at - 2 station.

## 2014-08-18 NOTE — Progress Notes (Signed)
Delivery of live viable female by Dr Ambrose MantleHenley. APGARS 8, 9. NICU and RROBRN at bedside for delivery.

## 2014-08-18 NOTE — Anesthesia Procedure Notes (Signed)
Epidural Patient location during procedure: OB Start time: 08/18/2014 5:39 AM End time: 08/18/2014 5:43 AM  Staffing Anesthesiologist: Leilani AbleHATCHETT, Dashanique Brownstein Performed by: anesthesiologist   Preanesthetic Checklist Completed: patient identified, surgical consent, pre-op evaluation, timeout performed, IV checked, risks and benefits discussed and monitors and equipment checked  Epidural Patient position: sitting Prep: site prepped and draped and DuraPrep Patient monitoring: continuous pulse ox and blood pressure Approach: midline Location: L3-L4 Injection technique: LOR air  Needle:  Needle type: Tuohy  Needle gauge: 17 G Needle length: 9 cm and 9 Needle insertion depth: 6 cm Catheter type: closed end flexible Catheter size: 19 Gauge Catheter at skin depth: 10 cm Test dose: negative and Other  Assessment Sensory level: T9 Events: blood not aspirated, injection not painful, no injection resistance, negative IV test and no paresthesia

## 2014-08-18 NOTE — MAU Note (Addendum)
PT  SAYS SROM  AT -  SHE WAS   SITTING ON SOFA-   FELT  GUSH  - SAYS  FLUID   STILL  COMING  OUT.    VE IN OFFICE - LAST WED   1   CM.     DENIES HSV AND MRSA.   GBS-  POSITIVE.     FEELS  MILD  UC

## 2014-08-19 LAB — CBC
HEMATOCRIT: 28.3 % — AB (ref 36.0–46.0)
Hemoglobin: 9.3 g/dL — ABNORMAL LOW (ref 12.0–15.0)
MCH: 27.6 pg (ref 26.0–34.0)
MCHC: 32.9 g/dL (ref 30.0–36.0)
MCV: 84 fL (ref 78.0–100.0)
Platelets: 111 10*3/uL — ABNORMAL LOW (ref 150–400)
RBC: 3.37 MIL/uL — ABNORMAL LOW (ref 3.87–5.11)
RDW: 19.4 % — AB (ref 11.5–15.5)
WBC: 16.3 10*3/uL — ABNORMAL HIGH (ref 4.0–10.5)

## 2014-08-19 NOTE — Progress Notes (Signed)
UR chart review completed.  

## 2014-08-19 NOTE — Lactation Note (Signed)
This note was copied from the chart of Pamela Linday Sochacki. Lactation Consultation Note  Initial visit done.  Breastfeeding consultation services and support information given and reviewed with patient.  Baby is currently on breast actively nursing.  Reviewed feeding cues and instructed to feed with any cue.  Mom concerned she doesn't have any milk and baby has had formula once.  Teaching done and reassured colostrum is present in the first few days then milk comes to volume.  Explained to parents that colostrum is sufficient for baby and medical staff will be monitoring voids, stools and weights.  Encouraged to call with concerns or latch assist prn.  Patient Name: Pamela Hoover ZOXWR'UToday's Date: 08/19/2014 Reason for consult: Initial assessment   Maternal Data    Feeding Feeding Type: Breast Fed  LATCH Score/Interventions Latch: Grasps breast easily, tongue down, lips flanged, rhythmical sucking. Intervention(s): Adjust position;Assist with latch;Breast massage  Audible Swallowing: A few with stimulation Intervention(s): Skin to skin  Type of Nipple: Everted at rest and after stimulation  Comfort (Breast/Nipple): Soft / non-tender     Hold (Positioning): Assistance needed to correctly position infant at breast and maintain latch.  LATCH Score: 8  Lactation Tools Discussed/Used     Consult Status Consult Status: Follow-up Date: 08/20/14 Follow-up type: In-patient    Huston FoleyMOULDEN, Ezriel Boffa S 08/19/2014, 3:13 PM

## 2014-08-19 NOTE — Progress Notes (Signed)
Post Partum Day 1 Subjective: no complaints, up ad lib, tolerating PO and nl lochia, pain controlled.    Objective: Blood pressure 90/53, pulse 69, temperature 97.6 F (36.4 C), temperature source Oral, resp. rate 18, height 5\' 2"  (1.575 m), weight 74.844 kg (165 lb), SpO2 99 %, unknown if currently breastfeeding.  Physical Exam:  General: alert and no distress Lochia: appropriate Uterine Fundus: firm   Recent Labs  08/18/14 0240 08/19/14 0615  HGB 10.7* 9.3*  HCT 32.6* 28.3*    Assessment/Plan: Plan for discharge tomorrow, Breastfeeding and Lactation consult.  Successful VBAC, PPD#1, routine care.     LOS: 1 day   Bovard-Stuckert, Najmo Pardue 08/19/2014, 8:59 AM

## 2014-08-19 NOTE — Anesthesia Postprocedure Evaluation (Signed)
  Anesthesia Post-op Note  Patient: Pamela Hoover  Procedure(s) Performed: * No procedures listed *  Patient Location: Mother/Baby  Anesthesia Type:Epidural  Level of Consciousness: awake, alert , oriented and patient cooperative  Airway and Oxygen Therapy: Patient Spontanous Breathing  Post-op Pain: mild  Post-op Assessment: Post-op Vital signs reviewed, Patient's Cardiovascular Status Stable, Respiratory Function Stable, Patent Airway, No headache, No backache, No residual numbness and No residual motor weakness  Post-op Vital Signs: Reviewed and stable  Last Vitals:  Filed Vitals:   08/19/14 1011  BP: 100/58  Pulse: 80  Temp: 36.6 C  Resp: 18    Complications: No apparent anesthesia complications

## 2014-08-20 MED ORDER — OXYCODONE-ACETAMINOPHEN 5-325 MG PO TABS: 1.0000 | ORAL_TABLET | Freq: Four times a day (QID) | ORAL | Status: DC | PRN

## 2014-08-20 MED ORDER — IBUPROFEN 800 MG PO TABS: 800.0000 mg | ORAL_TABLET | Freq: Three times a day (TID) | ORAL | Status: DC | PRN

## 2014-08-20 NOTE — Discharge Summary (Signed)
Obstetric Discharge Summary Reason for Admission: onset of labor Prenatal Procedures: none Intrapartum Procedures: spontaneous vaginal delivery, successful VBAC Postpartum Procedures: none Complications-Operative and Postpartum: 2nd degree perineal laceration and labial laceration HEMOGLOBIN  Date Value Ref Range Status  08/19/2014 9.3* 12.0 - 15.0 g/dL Final   HCT  Date Value Ref Range Status  08/19/2014 28.3* 36.0 - 46.0 % Final    Physical Exam:  General: alert and no distress Lochia: appropriate Uterine Fundus: firm  Discharge Diagnoses: Term Pregnancy-delivered  Discharge Information: Date: 08/20/2014 Activity: pelvic rest Diet: routine Medications: PNV, Ibuprofen and Percocet Condition: stable Instructions: refer to practice specific booklet Discharge to: home Follow-up Information    Follow up with Bing PlumeHENLEY,THOMAS F, MD. Schedule an appointment as soon as possible for a visit in 6 weeks.   Specialty:  Obstetrics and Gynecology   Why:  for postpartum check   Contact information:   [address], [address] [place] [id number] [phone]       Newborn Data: Live born female  Birth Weight: 6 lb 15.8 oz (3170 g) APGAR: 8, 9  Home with mother.  Pamela Hoover, Pamela Hoover 08/20/2014, 6:00 AM

## 2014-08-20 NOTE — Lactation Note (Signed)
This note was copied from the chart of Girl Delorese Platt. Lactation Consultation Note  Patient Name: Girl Karie SchwalbeLaila Tatro UJWJX'BToday's Date: 08/20/2014 Reason for consult: Follow-up assessment;Breast/nipple pain Mom c/o of sore nipples with latch. She has been using cradle hold. LC assisted Mom with positioning and obtaining good depth. Baby sleepy at this visit, but demonstrated a good suckling pattern with stimulation off/on. Mom had pain with initial latch that improved as baby was nursing but did not resolve completely. Demonstrated to Mom how to bring bottom lip down for deeper latch. No trauma noted on nipples. Care for sore nipples reviewed, comfort gels given with instructions. Mom requested hand pump for home use. Advised Mom baby should be the breast 8-12 times in 24 hours and nursing 15-20 minutes both breasts if possible. Monitor void/stools. If baby not sustaining latch then Mom needs to pump and supplement according to guidelines given or if output not WNL. Advised Mom to refer to page 24-25 Baby N Me Booklet for chart on output and storage guidelines with pumping. Engorgement care discussed. Advised of OP services and support group.   Maternal Data    Feeding Feeding Type: Breast Fed Length of feed: 20 min (off and on)  LATCH Score/Interventions Latch: Grasps breast easily, tongue down, lips flanged, rhythmical sucking. Intervention(s): Adjust position;Assist with latch;Breast massage;Breast compression  Audible Swallowing: A few with stimulation  Type of Nipple: Everted at rest and after stimulation  Comfort (Breast/Nipple): Filling, red/small blisters or bruises, mild/mod discomfort  Problem noted: Mild/Moderate discomfort Interventions (Mild/moderate discomfort): Comfort gels;Hand massage;Hand expression  Hold (Positioning): Assistance needed to correctly position infant at breast and maintain latch. Intervention(s): Breastfeeding basics reviewed;Support Pillows;Position  options;Skin to skin  LATCH Score: 7  Lactation Tools Discussed/Used Tools: Comfort gels;Pump Breast pump type: Manual WIC Program: Yes   Consult Status Consult Status: Complete Date: 08/20/14 Follow-up type: In-patient    Alfred LevinsGranger, Huxley Shurley Ann 08/20/2014, 12:12 PM

## 2014-08-20 NOTE — Progress Notes (Signed)
Post Partum Day 2 Subjective: no complaints, up ad lib, voiding, tolerating PO and nl lochia, pain controlled  Objective: Blood pressure 111/68, pulse 78, temperature 97.8 F (36.6 C), temperature source Oral, resp. rate 18, height 5\' 2"  (1.575 m), weight 74.844 kg (165 lb), SpO2 100 %, unknown if currently breastfeeding.  Physical Exam:  General: alert and no distress Lochia: appropriate Uterine Fundus: firm   Recent Labs  08/18/14 0240 08/19/14 0615  HGB 10.7* 9.3*  HCT 32.6* 28.3*    Assessment/Plan: Discharge home, Breastfeeding and Lactation consult.  rouine PP care/instructions.  D/c with motrin/percocet/pnv.  F/u 6 wks   LOS: 2 days   Bovard-Stuckert, Crucita Lacorte 08/20/2014, 5:55 AM

## 2016-09-03 ENCOUNTER — Ambulatory Visit (INDEPENDENT_AMBULATORY_CARE_PROVIDER_SITE_OTHER): Payer: BLUE CROSS/BLUE SHIELD | Admitting: Family Medicine

## 2016-09-03 VITALS — BP 90/58 | HR 130 | Temp 101.6°F | Resp 18 | Wt 131.6 lb

## 2016-09-03 DIAGNOSIS — R509 Fever, unspecified: Secondary | ICD-10-CM

## 2016-09-03 LAB — POCT URINALYSIS DIP (MANUAL ENTRY)
Bilirubin, UA: NEGATIVE
Glucose, UA: NEGATIVE
Leukocytes, UA: NEGATIVE
NITRITE UA: NEGATIVE
Protein Ur, POC: 100 — AB
Spec Grav, UA: 1.025
Urobilinogen, UA: 0.2
pH, UA: 5.5

## 2016-09-03 LAB — POCT CBC
Granulocyte percent: 86.4 %G — AB (ref 37–80)
HCT, POC: 32.4 % — AB (ref 37.7–47.9)
Hemoglobin: 11.3 g/dL — AB (ref 12.2–16.2)
Lymph, poc: 0.8 (ref 0.6–3.4)
MCH, POC: 28.1 pg (ref 27–31.2)
MCHC: 35 g/dL (ref 31.8–35.4)
MCV: 80.4 fL (ref 80–97)
MID (CBC): 0.1 (ref 0–0.9)
MPV: 10.1 fL (ref 0–99.8)
POC Granulocyte: 5.6 (ref 2–6.9)
POC LYMPH %: 12.5 % (ref 10–50)
POC MID %: 1.1 %M (ref 0–12)
Platelet Count, POC: 157 10*3/uL (ref 142–424)
RBC: 4.03 M/uL — AB (ref 4.04–5.48)
RDW, POC: 15.1 %
WBC: 6.5 10*3/uL (ref 4.6–10.2)

## 2016-09-03 LAB — POCT INFLUENZA A/B
INFLUENZA B, POC: NEGATIVE
Influenza A, POC: NEGATIVE

## 2016-09-03 LAB — POCT URINE PREGNANCY: Preg Test, Ur: NEGATIVE

## 2016-09-03 LAB — POC MICROSCOPIC URINALYSIS (UMFC): Mucus: ABSENT

## 2016-09-03 MED ORDER — OSELTAMIVIR PHOSPHATE 75 MG PO CAPS: 75.0000 mg | ORAL_CAPSULE | Freq: Two times a day (BID) | ORAL | 0 refills | Status: DC

## 2016-09-03 MED ORDER — ACETAMINOPHEN 500 MG PO TABS
500.0000 mg | ORAL_TABLET | Freq: Once | ORAL | Status: AC
  Administered 2016-09-03: 500 mg via ORAL

## 2016-09-03 MED ORDER — IBUPROFEN 200 MG PO TABS
400.0000 mg | ORAL_TABLET | Freq: Once | ORAL | Status: AC
  Administered 2016-09-03: 400 mg via ORAL

## 2016-09-03 MED ORDER — IBUPROFEN 800 MG PO TABS: 800.0000 mg | ORAL_TABLET | Freq: Three times a day (TID) | ORAL | 0 refills | Status: DC | PRN

## 2016-09-03 NOTE — Progress Notes (Signed)
Patient ID: Pamela Hoover, female    DOB: 03/23/1986, 31 y.o.   MRN: [id number]  PCP: No primary care provider on file.  No chief complaint on file.   Subjective:  HPI  31 year old female presents for evaluation of headache, back pain, sore throat x 48 hours. Headache, back pain bilateral, and painful sore throat . Some mild cough.  No shortness of breath or wheezing. Denies urinary frequency or burning. She has had fever since yesterday. Reports that both children have recently been dx influenza. Decreased appetite and not drinking much.  Social History   Social History  . Marital status: Married    Spouse name: N/A  . Number of children: N/A  . Years of education: N/A   Occupational History  . Not on file.   Social History Main Topics  . Smoking status: Never Smoker  . Smokeless tobacco: Never Used  . Alcohol use No  . Drug use: No  . Sexual activity: Not on file   Other Topics Concern  . Not on file   Social History Narrative  . No narrative on file    Family History  Problem Relation Age of Onset  . Diabetes Maternal Aunt    Review of Systems HPI Patient Active Problem List   Diagnosis Date Noted  . Active labor 08/18/2014  . Status post vacuum-assisted vaginal delivery 08/18/2014   No Known Allergies  Prior to Admission medications   Medication Sig Start Date End Date Taking? Authorizing Provider  pseudoephedrine-acetaminophen (TYLENOL SINUS) 30-500 MG TABS tablet Take 1 tablet by mouth every 4 (four) hours as needed.   Yes Historical Provider, MD  ibuprofen (ADVIL,MOTRIN) 800 MG tablet Take 1 tablet (800 mg total) by mouth every 8 (eight) hours as needed. Patient not taking: Reported on 09/03/2016 08/20/14   Sherian ReinJody Bovard-Stuckert, MD  IRON PO Take 2 tablets by mouth daily.     Historical Provider, MD  oxyCODONE-acetaminophen (PERCOCET/ROXICET) 5-325 MG per tablet Take 1-2 tablets by mouth every 6 (six) hours as needed for severe pain. Patient not taking:  Reported on 09/03/2016 08/20/14   Sherian ReinJody Bovard-Stuckert, MD    Past Medical, Surgical Family and Social History reviewed and updated.    Objective:   Today's Vitals   09/03/16 1803  BP: (!) 90/58  Pulse: (!) 130  Resp: 18  Temp: (!) 101.6 F (38.7 C)  TempSrc: Oral  SpO2: 99%  Weight: 131 lb 9.6 oz (59.7 kg)    Wt Readings from Last 3 Encounters:  09/03/16 131 lb 9.6 oz (59.7 kg)  08/18/14 165 lb (74.8 kg)  10/03/12 162 lb (73.5 kg)   Physical Exam  Constitutional: She is oriented to person, place, and time. She appears well-developed and well-nourished.  HENT:  Head: Normocephalic and atraumatic.  Right Ear: External ear normal.  Left Ear: External ear normal.  Nose: Nose normal.  Mouth/Throat: Oropharynx is clear and moist.  Eyes: Conjunctivae and EOM are normal. Pupils are equal, round, and reactive to light.  Neck: Normal range of motion. Neck supple.  Cardiovascular: Normal rate, regular rhythm, normal heart sounds and intact distal pulses.   Pulmonary/Chest: Effort normal and breath sounds normal. No respiratory distress. She has no wheezes. She exhibits tenderness.  Musculoskeletal: Normal range of motion.  Lymphadenopathy:    She has cervical adenopathy.  Neurological: She is alert and oriented to person, place, and time.  Skin: Skin is warm. She is diaphoretic.  Psychiatric: She has a normal mood and affect.  Her behavior is normal. Judgment and thought content normal.     Assessment & Plan:  1. Fever, unspecified fever cause - POCT Influenza A/B-negative, although symptoms are consistent with influenza. Treating empirically for influenza.  -Ostelamivir (Tamiflu) 75 mg capsule, 2 times daily. -Increase hydration by drinking 6-8 glasses of fluids daily. -Increase appetite as tolerated.  Godfrey Pick. Tiburcio Pea, MSN, FNP-C Primary Care at [facility] [phone]

## 2016-09-03 NOTE — Patient Instructions (Addendum)
Tamiflu take 1 tablet 75 mg twice daily for 5 days.  Increase fluid intake.  Eat food as tolerated.  Wash hands persistently.   IF you received an x-ray today, you will receive an invoice from [facility]. Please contact [facility] at [phone] with questions or concerns regarding your invoice.   IF you received labwork today, you will receive an invoice from [facility]. Please contact [facility] at [phone] with questions or concerns regarding your invoice.   Our billing staff will not be able to assist you with questions regarding bills from these companies.  You will be contacted with the lab results as soon as they are available. The fastest way to get your results is to activate your My Chart account. Instructions are located on the last page of this paperwork. If you have not heard from [place] regarding the results in 2 weeks, please contact this office.     Avian Influenza, Adult Avian influenza is also known as bird flu. Avian influenza is a group of viruses that occur naturally in wild and domestic birds. Avian influenza is easily spread (contagious) among birds and is deadly to them. Birds become infected when they come into contact with infected birds or contaminated surfaces. The avian influenza viruses are spread from country to country through the international poultry trade or by migrating birds. Although rare, avian influenza can cause severe illness in humans. It is also rare for there to be human-to-human transmission of avian influenza. However, influenza viruses can change, so human-to-human transmission may become more likely in the future. What are the causes? This condition is caused by infected birds that spread avian influenza through their feces, saliva, and fluids from the nose (nasal secretions). Avian influenza can then infect humans who:  Come in contact with infected birds. This can happen with a bird that is dead or alive.  Breathe in  contaminated dust.  Touch contaminated surfaces. What increases the risk? This condition is more likely to develop in people who come in close contact with birds or poultry. What are the signs or symptoms? Symptoms of this condition include:  Fever.  Cough.  Sore throat.  Nausea and vomiting.  Diarrhea.  Muscle aches.  Tiredness (fatigue).  Inflammation or redness of the eyes (conjunctivitis).  Shortness of breath.  Difficulty breathing.  Pain in the abdomen.  Seizures.  Mental confusion. How is this diagnosed? This condition may be diagnosed by:  Medical history and physical exam.  Chest X-ray.  Examination of a fluid sample from your throat or nose.  Blood tests. How is this treated? This condition may be treated by:  Medicines that stop the growth of the viruses in your body (antiviral medicines).  Supportive care to relieve your symptoms. Follow these instructions at home:  Take over-the-counter and prescription medicines only as told by your health care provider.  Use a cool mist humidifier. This makes breathing easier.  Rest as directed by your health care provider.  Drink enough fluid to keep your urine clear or pale yellow.  Cover your mouth and nose when coughing or sneezing.  Wash your hands well to prevent the viruses from spreading.  Avoid crowded areas. Stay home from work or school until directed by your health care provider. How is this prevented?  Stay home from work and school when you are sick. Not being in contact with other people will help stop the spread of illness.  Cover your mouth and nose with your arm when coughing or sneezing.  This may help keep those around you from getting sick.  Wash your hands often with warm water and soap. Illnesses are often spread when a person touches something that is contaminated with germs and then touches his or her eyes, nose, or mouth.  If you think that you have been exposed to avian  influenza, ask your health care provider about preventive antiviral medicines. These can help prevent infection from occurring. Contact a health care provider if:  You have a fever.  You have a skin rash.  You have new symptoms.  Your symptoms get worse.  You are urinating noticeably less or not at all.  Your symptoms do not get better with treatment. Get help right away if:  Your skin or nails turn bluish.  You have chest pain.  You have trouble breathing.  You feel like your heart is fluttering, skipping a beat, or beating faster than normal.  You become confused.  You have chills, weakness, or light-headedness.  You develop a sudden headache.  You develop pain in your face or ear.  You have severe pain or stiffness in your neck.  You cough up blood.  You have nausea and vomiting that you cannot control. This information is not intended to replace advice given to you by your health care provider. Make sure you discuss any questions you have with your health care provider. Document Released: 08/16/2003 Document Revised: 03/07/2016 Document Reviewed: 01/21/2016 Elsevier Interactive Patient Education  2017 [employer]

## 2017-08-27 NOTE — L&D Delivery Note (Signed)
Delivery Note She progressed quickly to complete and pushed well without pain medication.  At 2:05 AM a viable female was delivered via VBAC, Spontaneous (Presentation: vtx; ROA ).  APGAR: 9, 9; weight pending.   Placenta status: spontaneous, intact.  Cord:  with the following complications: true knot.  Anesthesia:  None Episiotomy: None Lacerations: None Suture Repair: none Est. Blood Loss (mL): 200  Mom to postpartum.  Baby to Couplet care / Skin to Skin.  Leighton Roachodd D Kya Mayfield 08/21/2018, 2:21 AM

## 2018-01-30 LAB — OB RESULTS CONSOLE GC/CHLAMYDIA
Chlamydia: NEGATIVE
Gonorrhea: NEGATIVE

## 2018-01-30 LAB — OB RESULTS CONSOLE ABO/RH: RH Type: POSITIVE

## 2018-01-30 LAB — OB RESULTS CONSOLE ANTIBODY SCREEN: ANTIBODY SCREEN: NEGATIVE

## 2018-01-30 LAB — OB RESULTS CONSOLE HEPATITIS B SURFACE ANTIGEN: Hepatitis B Surface Ag: NEGATIVE

## 2018-01-30 LAB — OB RESULTS CONSOLE HIV ANTIBODY (ROUTINE TESTING): HIV: NONREACTIVE

## 2018-01-30 LAB — OB RESULTS CONSOLE RPR: RPR: NONREACTIVE

## 2018-01-30 LAB — OB RESULTS CONSOLE RUBELLA ANTIBODY, IGM: Rubella: IMMUNE

## 2018-07-28 LAB — OB RESULTS CONSOLE GBS: STREP GROUP B AG: NEGATIVE

## 2018-08-21 ENCOUNTER — Encounter (HOSPITAL_COMMUNITY): Payer: Self-pay

## 2018-08-21 ENCOUNTER — Inpatient Hospital Stay (HOSPITAL_COMMUNITY)
Admission: AD | Admit: 2018-08-21 | Discharge: 2018-08-23 | DRG: 807 | Disposition: A | Discharge status: Home / Self Care | Payer: Medicaid Other | Attending: Obstetrics and Gynecology | Admitting: Obstetrics and Gynecology

## 2018-08-21 DIAGNOSIS — O34219 Maternal care for unspecified type scar from previous cesarean delivery: Secondary | ICD-10-CM | POA: Diagnosis present

## 2018-08-21 DIAGNOSIS — O358XX0 Maternal care for other (suspected) fetal abnormality and damage, not applicable or unspecified: Secondary | ICD-10-CM | POA: Diagnosis present

## 2018-08-21 DIAGNOSIS — Z3A39 39 weeks gestation of pregnancy: Secondary | ICD-10-CM

## 2018-08-21 DIAGNOSIS — Z3483 Encounter for supervision of other normal pregnancy, third trimester: Secondary | ICD-10-CM | POA: Diagnosis present

## 2018-08-21 LAB — RPR: RPR Ser Ql: NONREACTIVE

## 2018-08-21 LAB — CBC
HCT: 37 % (ref 36.0–46.0)
Hemoglobin: 12 g/dL (ref 12.0–15.0)
MCH: 28 pg (ref 26.0–34.0)
MCHC: 32.4 g/dL (ref 30.0–36.0)
MCV: 86.4 fL (ref 80.0–100.0)
Platelets: 150 10*3/uL (ref 150–400)
RBC: 4.28 MIL/uL (ref 3.87–5.11)
RDW: 19.7 % — ABNORMAL HIGH (ref 11.5–15.5)
WBC: 12.8 10*3/uL — AB (ref 4.0–10.5)
nRBC: 0 % (ref 0.0–0.2)

## 2018-08-21 LAB — TYPE AND SCREEN
ABO/RH(D): O POS
Antibody Screen: NEGATIVE

## 2018-08-21 MED ORDER — OXYTOCIN 40 UNITS IN LACTATED RINGERS INFUSION - SIMPLE MED
2.5000 [IU]/h | INTRAVENOUS | Status: DC
  Filled 2018-08-21: qty 1000

## 2018-08-21 MED ORDER — MEASLES, MUMPS & RUBELLA VAC IJ SOLR
0.5000 mL | Freq: Once | INTRAMUSCULAR | Status: DC
  Filled 2018-08-21: qty 0.5

## 2018-08-21 MED ORDER — OXYTOCIN BOLUS FROM INFUSION
500.0000 mL | Freq: Once | INTRAVENOUS | Status: AC
  Administered 2018-08-21: 500 mL via INTRAVENOUS

## 2018-08-21 MED ORDER — FLEET ENEMA 7-19 GM/118ML RE ENEM: 1.0000 | ENEMA | RECTAL | Status: DC | PRN

## 2018-08-21 MED ORDER — WITCH HAZEL-GLYCERIN EX PADS: 1.0000 "application " | MEDICATED_PAD | CUTANEOUS | Status: DC | PRN

## 2018-08-21 MED ORDER — ONDANSETRON HCL 4 MG/2ML IJ SOLN: 4.0000 mg | Freq: Four times a day (QID) | INTRAMUSCULAR | Status: DC | PRN

## 2018-08-21 MED ORDER — OXYCODONE-ACETAMINOPHEN 5-325 MG PO TABS: 1.0000 | ORAL_TABLET | ORAL | Status: DC | PRN

## 2018-08-21 MED ORDER — ONDANSETRON HCL 4 MG PO TABS: 4.0000 mg | ORAL_TABLET | ORAL | Status: DC | PRN

## 2018-08-21 MED ORDER — LIDOCAINE HCL (PF) 1 % IJ SOLN
30.0000 mL | INTRAMUSCULAR | Status: DC | PRN
  Filled 2018-08-21: qty 30

## 2018-08-21 MED ORDER — ACETAMINOPHEN 325 MG PO TABS
650.0000 mg | ORAL_TABLET | ORAL | Status: DC | PRN
  Administered 2018-08-21 (×2): 650 mg via ORAL
  Filled 2018-08-21 (×2): qty 2

## 2018-08-21 MED ORDER — LACTATED RINGERS IV SOLN: 500.0000 mL | INTRAVENOUS | Status: DC | PRN

## 2018-08-21 MED ORDER — COCONUT OIL OIL
1.0000 "application " | TOPICAL_OIL | Status: DC | PRN
  Administered 2018-08-22: 1 via TOPICAL
  Filled 2018-08-21: qty 120

## 2018-08-21 MED ORDER — IBUPROFEN 600 MG PO TABS
600.0000 mg | ORAL_TABLET | Freq: Four times a day (QID) | ORAL | Status: DC
  Administered 2018-08-21 – 2018-08-23 (×10): 600 mg via ORAL
  Filled 2018-08-21 (×10): qty 1

## 2018-08-21 MED ORDER — PRENATAL MULTIVITAMIN CH
1.0000 | ORAL_TABLET | Freq: Every day | ORAL | Status: DC
  Administered 2018-08-21 – 2018-08-23 (×3): 1 via ORAL
  Filled 2018-08-21 (×3): qty 1

## 2018-08-21 MED ORDER — SENNOSIDES-DOCUSATE SODIUM 8.6-50 MG PO TABS
2.0000 | ORAL_TABLET | ORAL | Status: DC
  Administered 2018-08-22 – 2018-08-23 (×2): 2 via ORAL
  Filled 2018-08-21 (×2): qty 2

## 2018-08-21 MED ORDER — ONDANSETRON HCL 4 MG/2ML IJ SOLN: 4.0000 mg | INTRAMUSCULAR | Status: DC | PRN

## 2018-08-21 MED ORDER — OXYCODONE HCL 5 MG PO TABS: 10.0000 mg | ORAL_TABLET | ORAL | Status: DC | PRN

## 2018-08-21 MED ORDER — TETANUS-DIPHTH-ACELL PERTUSSIS 5-2.5-18.5 LF-MCG/0.5 IM SUSP: 0.5000 mL | Freq: Once | INTRAMUSCULAR | Status: DC

## 2018-08-21 MED ORDER — METHYLERGONOVINE MALEATE 0.2 MG PO TABS
0.2000 mg | ORAL_TABLET | ORAL | Status: AC | PRN
  Administered 2018-08-21 – 2018-08-22 (×6): 0.2 mg via ORAL
  Filled 2018-08-21 (×6): qty 1

## 2018-08-21 MED ORDER — SOD CITRATE-CITRIC ACID 500-334 MG/5ML PO SOLN: 30.0000 mL | ORAL | Status: DC | PRN

## 2018-08-21 MED ORDER — DIBUCAINE 1 % RE OINT: 1.0000 "application " | TOPICAL_OINTMENT | RECTAL | Status: DC | PRN

## 2018-08-21 MED ORDER — METHYLERGONOVINE MALEATE 0.2 MG/ML IJ SOLN: 0.2000 mg | INTRAMUSCULAR | Status: AC | PRN

## 2018-08-21 MED ORDER — DIPHENHYDRAMINE HCL 25 MG PO CAPS: 25.0000 mg | ORAL_CAPSULE | Freq: Four times a day (QID) | ORAL | Status: DC | PRN

## 2018-08-21 MED ORDER — BENZOCAINE-MENTHOL 20-0.5 % EX AERO: 1.0000 "application " | INHALATION_SPRAY | CUTANEOUS | Status: DC | PRN

## 2018-08-21 MED ORDER — MAGNESIUM HYDROXIDE 400 MG/5ML PO SUSP: 30.0000 mL | ORAL | Status: DC | PRN

## 2018-08-21 MED ORDER — OXYCODONE-ACETAMINOPHEN 5-325 MG PO TABS: 2.0000 | ORAL_TABLET | ORAL | Status: DC | PRN

## 2018-08-21 MED ORDER — OXYCODONE HCL 5 MG PO TABS: 5.0000 mg | ORAL_TABLET | ORAL | Status: DC | PRN

## 2018-08-21 MED ORDER — ACETAMINOPHEN 325 MG PO TABS
650.0000 mg | ORAL_TABLET | ORAL | Status: DC | PRN
  Administered 2018-08-21: 650 mg via ORAL
  Filled 2018-08-21: qty 2

## 2018-08-21 MED ORDER — LACTATED RINGERS IV SOLN
INTRAVENOUS | Status: DC
  Administered 2018-08-21: 02:00:00 via INTRAVENOUS

## 2018-08-21 MED ORDER — SIMETHICONE 80 MG PO CHEW: 80.0000 mg | CHEWABLE_TABLET | ORAL | Status: DC | PRN

## 2018-08-21 MED ORDER — ZOLPIDEM TARTRATE 5 MG PO TABS: 5.0000 mg | ORAL_TABLET | Freq: Every evening | ORAL | Status: DC | PRN

## 2018-08-21 NOTE — Lactation Note (Signed)
This note was copied from a baby's chart. Lactation Consultation Note  Patient Name: Girl Pamela SchwalbeLaila Lezotte DGUYQ'IToday's Date: 08/21/2018 Reason for consult: Initial assessment;Term  Visited with P3 Mom of term baby at 609 hrs old.  Baby has already breastfed 4 times with latch score of 8-9.  Mom denies needing any assistance with breastfeeding.  Encouraged keeping baby STS and watching for feeding cues.  Goal of 8-12 feedings per 24 hrs.  Mom aware of IP and OP lactation support available to her.  Encouraged Mom to call for assistance prn.   Consult Status Consult Status: Follow-up Date: 08/22/18 Follow-up type: In-patient    Judee ClaraSmith, Delora Gravatt E 08/21/2018, 11:20 AM

## 2018-08-21 NOTE — H&P (Signed)
Pamela Hoover is a 32 y.o. female, G3P2002, [facility].  On eval in MAU, VE 5 cm with reg ctx.  She had SROM in MAU and progressed rapidly.  PNC complicated by previous CTOL followed by vacuum assisted VBAC.  First baby with cleft lip and palate.  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  OB History    Gravida  3   Para  2   Term  2   Preterm      AB      Living  2     SAB      TAB      Ectopic      Multiple  0   Live Births  2          Past Medical History:  Diagnosis Date  . Anemia   . No pertinent past medical history    Past Surgical History:  Procedure Laterality Date  . CESAREAN SECTION N/A 10/03/2012   Procedure: CESAREAN SECTION;  Surgeon: Lavina Hammanodd Annalise Mcdiarmid, MD;  Location: [facility];  Service: Obstetrics;  Laterality: N/A;  . NO PAST SURGERIES     Family History: family history includes Diabetes in her maternal aunt. Social History:  reports that she has never smoked. She has never used smokeless tobacco. She reports that she does not drink alcohol or use drugs.     Maternal Diabetes: No Genetic Screening: Normal Maternal Ultrasounds/Referrals: Abnormal:  Findings:   Isolated choroid plexus cyst Fetal Ultrasounds or other Referrals:  None Maternal Substance Abuse:  No Significant Maternal Medications:  None Significant Maternal Lab Results:  Lab values include: Group B Strep negative Other Comments:  None  Review of Systems  Respiratory: Negative.   Cardiovascular: Negative.    Maternal Medical History:  Reason for admission: Contractions.   Contractions: Frequency: regular.   Perceived severity is strong.    Fetal activity: Perceived fetal activity is normal.    Prenatal complications: no prenatal complications Prenatal Complications - Diabetes: none.    Dilation: 8 Effacement (%): 90 Station: -2 Exam by:: Latricia HeftAnna Cioce, RN Blood pressure 123/86, pulse 93, temperature 98 F (36.7 C),  temperature source Oral, resp. rate 20, height 5' 3.78" (1.62 m), weight 81.2 kg, SpO2 97 %, unknown if currently breastfeeding. Maternal Exam:  Uterine Assessment: Contraction strength is firm.  Contraction frequency is regular.   Abdomen: Patient reports no abdominal tenderness. Estimated fetal weight is 7 1/2 lbs.   Fetal presentation: vertex  Introitus: Normal vagina.  Amniotic fluid character: clear.  Pelvis: adequate for delivery.   Cervix: Cervix evaluated by digital exam.     Physical Exam  Constitutional: She appears well-developed and well-nourished.  Cardiovascular: Normal rate and regular rhythm.  Respiratory: Effort normal. No respiratory distress.  GI: Soft.    Prenatal labs: ABO, Rh: O/Positive/-- (06/06 0000) Antibody: Negative (06/06 0000) Rubella: Immune (06/06 0000) RPR: Nonreactive (06/06 0000)  HBsAg: Negative (06/06 0000)  HIV: Non-reactive (06/06 0000)  GBS: Negative (12/02 0000)   Assessment/Plan: IUP at 39+ weeks, previous LTCS and VBAC, admitted in active labor with SROM and rapid progression.  See delivery note.   Leighton Roachodd D Oseas Detty 08/21/2018, 2:18 AM

## 2018-08-21 NOTE — MAU Note (Signed)
CTX every 3-4 mins.  No LOF/VB.  + FM.  1 cm last week.

## 2018-08-21 NOTE — Progress Notes (Signed)
PPD #1 No problems, on Methergine for one clot after delivery Afeb, VSS Fundus firm, NT at U+1 Continue routine postpartum care, encouraged not to hold urine, continue Methergine and monitor bleeding

## 2018-08-22 NOTE — Progress Notes (Addendum)
Post Partum Day 1 (successful VBAC - some VB after delivery) Subjective: no complaints, up ad lib, voiding, tolerating PO and nl lochia, pain controlled  Objective: Blood pressure (!) 98/57, pulse 75, temperature 98.2 F (36.8 C), temperature source Oral, resp. rate 18, height 5' 3.78" (1.62 m), weight 81.2 kg, SpO2 98 %, unknown if currently breastfeeding.  Physical Exam:  General: alert and no distress Lochia: appropriate Uterine Fundus: firm  Recent Labs    08/21/18 0135  HGB 12.0  HCT 37.0    Assessment/Plan: Plan for discharge tomorrow, Breastfeeding and Lactation consult.  Routine PP care.     LOS: 1 day   Pamela Hoover 08/22/2018, 7:55 AM

## 2018-08-22 NOTE — Lactation Note (Signed)
This note was copied from a baby's chart. Lactation Consultation Note  Patient Name: Pamela Hoover KVQQV'ZToday's Date: 08/22/2018 Reason for consult: Follow-up assessment;Difficult latch;Term  P3 mother whose infant is now 9036 hours old.    Baby was crying in bassinet and mother was in the bathroom when I arrived.    Mother stated she just finished feeding 15 minutes ago.  I suggested we latch baby back onto breast since she seemed to be hungry.  Offered latch assistance and mother accepted.    Mother prefers the cradle position but I suggested the cross cradle with an explanation as to why this is preferred for a newborn.  Mother was willing to try this position and baby latched without difficulty to the right breast.  Demonstrated intermittent breast compressions during feeding and mother did a return demonstration.  Educated on the importance of obtaining a wide mouth with flanged lips and the goal of getting most of the areola into baby's mouth.  Once latched, mother denied pain with feeding.    I observed baby feeding for 13 minutes and mother continued to feed after I left the room.  She was concerned about obtaining a deep latch.  Educated on STS, how to awaken a sleepy baby, positioning, holding at the breast, jaw movements and correct body alignment for baby during feeding.  Reassured mother that she can call for latch assistance at the next feeding if needed.    Visitors present but they have been asked to wait patiently in the hall until baby is finished.  Stressed the importance of feeding baby when she shows cues and mother understands.     Maternal Data Has patient been taught Hand Expression?: Yes Does the patient have breastfeeding experience prior to this delivery?: Yes  Feeding Feeding Type: Breast Fed  LATCH Score Latch: Grasps breast easily, tongue down, lips flanged, rhythmical sucking.  Audible Swallowing: A few with stimulation  Type of Nipple: Everted at rest and  after stimulation  Comfort (Breast/Nipple): Soft / non-tender  Hold (Positioning): Assistance needed to correctly position infant at breast and maintain latch.  LATCH Score: 8  Interventions Interventions: Breast feeding basics reviewed;Assisted with latch;Skin to skin;Breast massage;Hand express;Breast compression;Position options;Support pillows;Adjust position  Lactation Tools Discussed/Used WIC Program: No   Consult Status Consult Status: Follow-up Date: 08/23/18 Follow-up type: In-patient    Shahla Betsill R Kempton Milne 08/22/2018, 2:56 PM

## 2018-08-23 ENCOUNTER — Ambulatory Visit: Payer: Self-pay

## 2018-08-23 MED ORDER — PRENATAL MULTIVITAMIN CH: 1.0000 | ORAL_TABLET | Freq: Every day | ORAL | 3 refills | Status: AC

## 2018-08-23 MED ORDER — IBUPROFEN 600 MG PO TABS: 600.0000 mg | ORAL_TABLET | Freq: Four times a day (QID) | ORAL | 1 refills | Status: AC | PRN

## 2018-08-23 NOTE — Progress Notes (Signed)
Post Partum Day 2 Subjective: no complaints, up ad lib, voiding, tolerating PO and nl lochia, pain controlled  Objective: Blood pressure (!) 98/57, pulse 81, temperature 98.3 F (36.8 C), temperature source Oral, resp. rate 18, height 5' 3.78" (1.62 m), weight 81.2 kg, SpO2 99 %, unknown if currently breastfeeding.  Physical Exam:  General: alert and no distress Lochia: appropriate Uterine Fundus: firm   Recent Labs    08/21/18 0135  HGB 12.0  HCT 37.0    Assessment/Plan: Discharge home, Breastfeeding and Lactation consult.  Routine PP care.  D/C with motrin and PNV.  F/u 6 weeks.     LOS: 2 days   Pamela Hoover 08/23/2018, 8:42 AM

## 2018-08-23 NOTE — Lactation Note (Signed)
This note was copied from a baby's chart. Lactation Consultation Note  Patient Name: Girl Karie SchwalbeLaila Wearing ZOXWR'UToday's Date: 08/23/2018   Infant at 54 hrs of age. Mom was found sleeping with infant in bed. Infant noted to be laying lengthwise on a pillow. I gently awakened Mom & explained that co-sleeping wasn't permitted. Mom explained that infant wouldn't sleep in her own bassinet. Infant was placed in bassinet & I also reminded Mom, in general, not to lay infants on pillows.   [name], Lavern Crimi [facility] 08/23/2018, 8:18 AM

## 2018-08-23 NOTE — Discharge Summary (Signed)
OB Discharge Summary     Patient Name: Pamela SchwalbeLaila Holsinger DOB: 08/12/1986 MRN: [id number]  Date of admission: 08/21/2018 Delivering MD: Jackelyn KnifeMEISINGER, TODD   Date of discharge: 08/23/2018  Admitting diagnosis: 40WKS CTX Intrauterine pregnancy: 6729w6d     Secondary diagnosis:  Active Problems:   Indication for care in labor or delivery SROM  Additional problems: N/A     Discharge diagnosis: Term Pregnancy Delivered and VBAC                                                                                                Post partum procedures:N/A  Augmentation: N/A  Complications: None  Hospital course:  Onset of Labor With Vaginal Delivery     32 y.o. yo G3P3003 at 5129w6d was admitted in Active Labor on 08/21/2018. Patient had an uncomplicated labor course as follows:  Membrane Rupture Time/Date: 1:35 AM ,08/21/2018   Intrapartum Procedures: Episiotomy: None [1]                                         Lacerations:  None [1]  Patient had a delivery of a Viable infant. 08/21/2018  Information for the patient's newborn:  Mickie Bailbbad, Girl Chermaine [[id number]][[id number]]  Delivery Method: Vag-Spont    Pateint had an uncomplicated postpartum course.  She is ambulating, tolerating a regular diet, passing flatus, and urinating well. Patient is discharged home in stable condition on 08/23/18.   Physical exam  Vitals:   08/22/18 0605 08/22/18 1454 08/22/18 2308 08/23/18 0627  BP: (!) 98/57 (!) 104/59 107/69 (!) 98/57  Pulse: 75 87 85 81  Resp: 18 16 16 18   Temp: 98.2 F (36.8 C) 98.2 F (36.8 C) 98.5 F (36.9 C) 98.3 F (36.8 C)  TempSrc: Oral Oral Oral Oral  SpO2: 98%  98% 99%  Weight:      Height:       General: alert and no distress Lochia: appropriate Uterine Fundus: firm  Labs: Lab Results  Component Value Date   WBC 12.8 (H) 08/21/2018   HGB 12.0 08/21/2018   HCT 37.0 08/21/2018   MCV 86.4 08/21/2018   PLT 150 08/21/2018   No flowsheet data found.  Discharge instruction: per After  Visit Summary and "Baby and Me Booklet".  After visit meds:  Allergies as of 08/23/2018   No Known Allergies     Medication List    TAKE these medications   ibuprofen 600 MG tablet Commonly known as:  ADVIL,MOTRIN Take 1 tablet (600 mg total) by mouth every 6 (six) hours as needed.   prenatal multivitamin Tabs tablet Take 1 tablet by mouth daily at 12 noon.       Diet: routine diet  Activity: Advance as tolerated. Pelvic rest for 6 weeks.   Outpatient follow up:6 weeks Follow up Appt:No future appointments. Follow up Visit:No follow-ups on file.  Postpartum contraception: Undecided  Newborn Data: Live born female  Birth Weight: 7 lb 5.1 oz (3320 g) APGAR: 9, 9  Newborn Delivery  Birth date/time:  08/21/2018 02:05:00 Delivery type:  VBAC, Spontaneous     Baby Feeding: Breast Disposition:home with mother   08/23/2018 Sherian ReinJody Bovard-Stuckert, MD

## 2018-08-23 NOTE — Lactation Note (Signed)
This note was copied from a baby's chart. Lactation Consultation Note  Patient Name: Pamela Hoover ZOXWR'UToday's Date: 08/23/2018   Mom's milk has come to volume. Infant was observed at breast for a very short feeding, but many, many swallows were noted (swallows verified by cervical auscultation). Mom has a hand pump for home. Mom knows that if she is uncomfortably full, then she can pump for comfort.   Mom has our phone # for post-discharge questions.   [name], [name] 08/23/2018, 1:44 PM

## 2022-07-27 DIAGNOSIS — Z419 Encounter for procedure for purposes other than remedying health state, unspecified: Secondary | ICD-10-CM | POA: Diagnosis not present

## 2022-08-27 DIAGNOSIS — Z419 Encounter for procedure for purposes other than remedying health state, unspecified: Secondary | ICD-10-CM | POA: Diagnosis not present

## 2022-09-27 DIAGNOSIS — Z419 Encounter for procedure for purposes other than remedying health state, unspecified: Secondary | ICD-10-CM | POA: Diagnosis not present

## 2022-10-26 DIAGNOSIS — Z419 Encounter for procedure for purposes other than remedying health state, unspecified: Secondary | ICD-10-CM | POA: Diagnosis not present

## 2022-11-26 DIAGNOSIS — Z419 Encounter for procedure for purposes other than remedying health state, unspecified: Secondary | ICD-10-CM | POA: Diagnosis not present

## 2022-12-26 DIAGNOSIS — Z419 Encounter for procedure for purposes other than remedying health state, unspecified: Secondary | ICD-10-CM | POA: Diagnosis not present

## 2023-01-04 ENCOUNTER — Other Ambulatory Visit: Payer: Self-pay

## 2023-01-04 ENCOUNTER — Emergency Department (HOSPITAL_COMMUNITY)
Admission: EM | Admit: 2023-01-04 | Discharge: 2023-01-05 | Disposition: A | Discharge status: Home / Self Care | Payer: Medicaid Other | Attending: Emergency Medicine | Admitting: Emergency Medicine

## 2023-01-04 DIAGNOSIS — N390 Urinary tract infection, site not specified: Secondary | ICD-10-CM | POA: Insufficient documentation

## 2023-01-04 DIAGNOSIS — R300 Dysuria: Secondary | ICD-10-CM | POA: Diagnosis present

## 2023-01-04 LAB — URINALYSIS, ROUTINE W REFLEX MICROSCOPIC
Bilirubin Urine: NEGATIVE
Glucose, UA: NEGATIVE mg/dL
Ketones, ur: NEGATIVE mg/dL
Nitrite: POSITIVE — AB
Protein, ur: >=300 mg/dL — AB
RBC / HPF: >50 RBC/hpf (ref 0–5)
Specific Gravity, Urine: 1.019 (ref 1.005–1.030)
WBC, UA: >50 WBC/hpf (ref 0–5)
pH: 5 (ref 5.0–8.0)

## 2023-01-04 LAB — PREGNANCY, URINE: Preg Test, Ur: NEGATIVE

## 2023-01-04 NOTE — ED Triage Notes (Signed)
Patient reports dysuria with hematuria onset yesterday . No pain /denies fever .

## 2023-01-05 MED ORDER — NITROFURANTOIN MONOHYD MACRO 100 MG PO CAPS
100.0000 mg | ORAL_CAPSULE | Freq: Once | ORAL | Status: AC
  Administered 2023-01-05: 100 mg via ORAL
  Filled 2023-01-05: qty 1

## 2023-01-05 MED ORDER — NITROFURANTOIN MONOHYD MACRO 100 MG PO CAPS: 100.0000 mg | ORAL_CAPSULE | Freq: Two times a day (BID) | ORAL | 0 refills | Status: AC

## 2023-01-05 NOTE — ED Provider Notes (Signed)
[place] EMERGENCY DEPARTMENT AT [facility] Provider Note  CSN: [id number] Arrival date & time: 01/04/23 2213  Chief Complaint(s) UTI  HPI Pamela Hoover is a 37 y.o. female    The history is provided by the patient.  Dysuria Pain quality:  Aching Pain severity:  Moderate Onset quality:  Gradual Duration:  2 days Timing:  Constant Progression:  Worsening Chronicity:  New Relieved by:  Nothing Worsened by:  Nothing Urinary symptoms: discolored urine and hematuria   Associated symptoms: abdominal pain   Associated symptoms: no fever, no flank pain, no nausea, no vaginal discharge and no vomiting     Past Medical History Past Medical History:  Diagnosis Date   Anemia    No pertinent past medical history    Patient Active Problem List   Diagnosis Date Noted   Indication for care in labor or delivery 08/21/2018   Active labor 08/18/2014   Status post vacuum-assisted vaginal delivery 08/18/2014   Home Medication(s) Prior to Admission medications   Medication Sig Start Date End Date Taking? Authorizing Provider  nitrofurantoin, macrocrystal-monohydrate, (MACROBID) 100 MG capsule Take 1 capsule (100 mg total) by mouth 2 (two) times daily. 01/05/23  Yes Williemae Muriel, Amadeo Garnet, MD  ibuprofen (ADVIL,MOTRIN) 600 MG tablet Take 1 tablet (600 mg total) by mouth every 6 (six) hours as needed. 08/23/18   Bovard-Stuckert, Augusto Gamble, MD  Prenatal Vit-Fe Fumarate-FA (PRENATAL MULTIVITAMIN) TABS tablet Take 1 tablet by mouth daily at 12 noon. 08/23/18   Bovard-Stuckert, Augusto Gamble, MD                                                                                                                                    Allergies Patient has no known allergies.  Review of Systems Review of Systems  Constitutional:  Negative for fever.  Gastrointestinal:  Positive for abdominal pain. Negative for nausea and vomiting.  Genitourinary:  Positive for dysuria. Negative for flank pain and vaginal  discharge.   As noted in HPI  Physical Exam Vital Signs  I have reviewed the triage vital signs BP 112/74   Pulse 84   Temp 98.6 F (37 C)   Resp 18   SpO2 98%   Physical Exam Vitals reviewed.  Constitutional:      General: She is not in acute distress.    Appearance: She is well-developed. She is not diaphoretic.  HENT:     Head: Normocephalic and atraumatic.     Right Ear: External ear normal.     Left Ear: External ear normal.     Nose: Nose normal.  Eyes:     General: No scleral icterus.    Conjunctiva/sclera: Conjunctivae normal.  Neck:     Trachea: Phonation normal.  Cardiovascular:     Rate and Rhythm: Normal rate and regular rhythm.  Pulmonary:     Effort: Pulmonary effort is normal. No respiratory distress.     Breath sounds:  No stridor.  Abdominal:     General: There is no distension.     Tenderness: There is no abdominal tenderness. There is no right CVA tenderness or left CVA tenderness.  Musculoskeletal:        General: Normal range of motion.     Cervical back: Normal range of motion.  Neurological:     Mental Status: She is alert and oriented to person, place, and time.  Psychiatric:        Behavior: Behavior normal.     ED Results and Treatments Labs (all labs ordered are listed, but only abnormal results are displayed) Labs Reviewed  URINALYSIS, ROUTINE W REFLEX MICROSCOPIC - Abnormal; Notable for the following components:      Result Value   Color, Urine AMBER (*)    APPearance CLOUDY (*)    Hgb urine dipstick LARGE (*)    Protein, ur >=300 (*)    Nitrite POSITIVE (*)    Leukocytes,Ua MODERATE (*)    Bacteria, UA FEW (*)    Non Squamous Epithelial 0-5 (*)    All other components within normal limits  PREGNANCY, URINE                                                                                                                         EKG  EKG Interpretation  Date/Time:    Ventricular Rate:    PR Interval:    QRS Duration:   QT  Interval:    QTC Calculation:   R Axis:     Text Interpretation:         Radiology No results found.  Medications Ordered in ED Medications  nitrofurantoin (macrocrystal-monohydrate) (MACROBID) capsule 100 mg (has no administration in time range)                                                                                                                                     Procedures Procedures  (including critical care time)  Medical Decision Making / ED Course  Click here for ABCD2, HEART and other calculators  Medical Decision Making Amount and/or Complexity of Data Reviewed Labs: ordered.  Risk Prescription drug management.    This patient presents to the ED for concern of dysuria and lower abdominal aching, this involves an extensive number of treatment options, and is a complaint that carries with it a high risk of complications and morbidity. The  differential diagnosis includes but not limited to:  UTI Will rule out pregnancy related process. Less concern for serious intra-abdominal inflammatory/infectious process given her benign abdomen.  May reconsider if urine is clean.    Work up Interpretation and Management:  Laboratory Tests ordered listed below with my independent interpretation: UA consistent with UTI UPT negative    Given first dose of Macrobid in the ER.       Final Clinical Impression(s) / ED Diagnoses Final diagnoses:  Acute lower UTI   The patient appears reasonably screened and/or stabilized for discharge and I doubt any other medical condition or other [facility] requiring further screening, evaluation, or treatment in the ED at this time. I have discussed the findings, Dx and Tx plan with the patient/family who expressed understanding and agree(s) with the plan. Discharge instructions discussed at length. The patient/family was given strict return precautions who verbalized understanding of the instructions. No further questions at time of  discharge.  Disposition: Discharge  Condition: Good  ED Discharge Orders          Ordered    nitrofurantoin, macrocrystal-monohydrate, (MACROBID) 100 MG capsule  2 times daily        01/05/23 0200             Follow Up: Primary care provider  Call  if you do not have a primary care physician, contact [facility] at [phone] for referral           This chart was dictated using voice recognition software.  Despite best efforts to proofread,  errors can occur which can change the documentation meaning.    Nira Conn, MD 01/05/23 [phone]

## 2023-01-05 NOTE — ED Notes (Signed)
Discharge instructions discussed with pt. Verbalized understanding. VSS. No questions or concerns regarding discharge  

## 2023-01-26 DIAGNOSIS — Z419 Encounter for procedure for purposes other than remedying health state, unspecified: Secondary | ICD-10-CM | POA: Diagnosis not present

## 2023-02-25 DIAGNOSIS — Z419 Encounter for procedure for purposes other than remedying health state, unspecified: Secondary | ICD-10-CM | POA: Diagnosis not present

## 2023-03-28 DIAGNOSIS — Z419 Encounter for procedure for purposes other than remedying health state, unspecified: Secondary | ICD-10-CM | POA: Diagnosis not present

## 2023-04-28 DIAGNOSIS — Z419 Encounter for procedure for purposes other than remedying health state, unspecified: Secondary | ICD-10-CM | POA: Diagnosis not present

## 2023-05-28 DIAGNOSIS — Z419 Encounter for procedure for purposes other than remedying health state, unspecified: Secondary | ICD-10-CM | POA: Diagnosis not present

## 2023-06-28 DIAGNOSIS — Z419 Encounter for procedure for purposes other than remedying health state, unspecified: Secondary | ICD-10-CM | POA: Diagnosis not present

## 2023-07-14 ENCOUNTER — Emergency Department (HOSPITAL_COMMUNITY)
Admission: EM | Admit: 2023-07-14 | Discharge: 2023-07-14 | Discharge status: Against Medical Advice | Payer: 59 | Attending: Emergency Medicine | Admitting: Emergency Medicine

## 2023-07-14 ENCOUNTER — Encounter (HOSPITAL_COMMUNITY): Payer: Self-pay | Admitting: Emergency Medicine

## 2023-07-14 DIAGNOSIS — M542 Cervicalgia: Secondary | ICD-10-CM | POA: Diagnosis not present

## 2023-07-14 DIAGNOSIS — Z5321 Procedure and treatment not carried out due to patient leaving prior to being seen by health care provider: Secondary | ICD-10-CM | POA: Diagnosis not present

## 2023-07-14 NOTE — ED Triage Notes (Signed)
Pt  here from home with c/o left side neck pain burning type pain she states that she has been under a lot stress, no pain with movement some slight sob

## 2023-07-28 DIAGNOSIS — Z419 Encounter for procedure for purposes other than remedying health state, unspecified: Secondary | ICD-10-CM | POA: Diagnosis not present

## 2023-08-28 DIAGNOSIS — Z419 Encounter for procedure for purposes other than remedying health state, unspecified: Secondary | ICD-10-CM | POA: Diagnosis not present

## 2023-09-28 DIAGNOSIS — Z419 Encounter for procedure for purposes other than remedying health state, unspecified: Secondary | ICD-10-CM | POA: Diagnosis not present

## 2023-10-26 DIAGNOSIS — Z419 Encounter for procedure for purposes other than remedying health state, unspecified: Secondary | ICD-10-CM | POA: Diagnosis not present

## 2023-12-07 DIAGNOSIS — Z419 Encounter for procedure for purposes other than remedying health state, unspecified: Secondary | ICD-10-CM | POA: Diagnosis not present

## 2024-01-06 DIAGNOSIS — Z419 Encounter for procedure for purposes other than remedying health state, unspecified: Secondary | ICD-10-CM | POA: Diagnosis not present

## 2024-02-06 DIAGNOSIS — Z419 Encounter for procedure for purposes other than remedying health state, unspecified: Secondary | ICD-10-CM | POA: Diagnosis not present

## 2024-03-07 DIAGNOSIS — Z419 Encounter for procedure for purposes other than remedying health state, unspecified: Secondary | ICD-10-CM | POA: Diagnosis not present

## 2024-04-06 DIAGNOSIS — R300 Dysuria: Secondary | ICD-10-CM | POA: Diagnosis not present

## 2024-04-07 DIAGNOSIS — Z419 Encounter for procedure for purposes other than remedying health state, unspecified: Secondary | ICD-10-CM | POA: Diagnosis not present

## 2024-05-08 DIAGNOSIS — Z419 Encounter for procedure for purposes other than remedying health state, unspecified: Secondary | ICD-10-CM | POA: Diagnosis not present

## 2024-07-08 DIAGNOSIS — Z419 Encounter for procedure for purposes other than remedying health state, unspecified: Secondary | ICD-10-CM | POA: Diagnosis not present

## 2024-08-07 DIAGNOSIS — Z419 Encounter for procedure for purposes other than remedying health state, unspecified: Secondary | ICD-10-CM | POA: Diagnosis not present
